# Patient Record
Sex: Male | Born: 1970
Health system: Southern US, Community
[De-identification: ages and names within clinical notes are randomized; demographics above are authoritative.]

## PROBLEM LIST (undated history)

## (undated) DIAGNOSIS — F1010 Alcohol abuse, uncomplicated: Secondary | ICD-10-CM

## (undated) DIAGNOSIS — I639 Cerebral infarction, unspecified: Secondary | ICD-10-CM

## (undated) DIAGNOSIS — Z8619 Personal history of other infectious and parasitic diseases: Secondary | ICD-10-CM

## (undated) DIAGNOSIS — E785 Hyperlipidemia, unspecified: Secondary | ICD-10-CM

## (undated) HISTORY — DX: Hyperlipidemia, unspecified: E78.5

## (undated) HISTORY — DX: Alcohol abuse, uncomplicated: F10.10

## (undated) HISTORY — DX: Personal history of other infectious and parasitic diseases: Z86.19

## (undated) HISTORY — DX: Cerebral infarction, unspecified: I63.9

---

## 1997-10-21 ENCOUNTER — Emergency Department (HOSPITAL_COMMUNITY): Admission: EM | Admit: 1997-10-21 | Discharge: 1997-10-21 | Payer: Self-pay | Admitting: Emergency Medicine

## 1998-09-20 ENCOUNTER — Emergency Department (HOSPITAL_COMMUNITY): Admission: EM | Admit: 1998-09-20 | Discharge: 1998-09-20 | Payer: Self-pay | Admitting: Emergency Medicine

## 2002-09-23 ENCOUNTER — Emergency Department (HOSPITAL_COMMUNITY): Admission: EM | Admit: 2002-09-23 | Discharge: 2002-09-23 | Payer: Self-pay | Admitting: Emergency Medicine

## 2011-05-23 DIAGNOSIS — I639 Cerebral infarction, unspecified: Secondary | ICD-10-CM

## 2011-05-23 HISTORY — DX: Cerebral infarction, unspecified: I63.9

## 2012-04-01 ENCOUNTER — Encounter (HOSPITAL_COMMUNITY): Payer: Self-pay | Admitting: Emergency Medicine

## 2012-04-01 ENCOUNTER — Emergency Department (HOSPITAL_COMMUNITY): Payer: Self-pay

## 2012-04-01 ENCOUNTER — Inpatient Hospital Stay (HOSPITAL_COMMUNITY)
Admission: EM | Admit: 2012-04-01 | Discharge: 2012-04-02 | DRG: 066 | Disposition: A | Discharge status: Home / Self Care | Payer: MEDICAID | Attending: Internal Medicine | Admitting: Internal Medicine

## 2012-04-01 ENCOUNTER — Other Ambulatory Visit: Payer: Self-pay

## 2012-04-01 DIAGNOSIS — F1010 Alcohol abuse, uncomplicated: Secondary | ICD-10-CM

## 2012-04-01 DIAGNOSIS — I6350 Cerebral infarction due to unspecified occlusion or stenosis of unspecified cerebral artery: Principal | ICD-10-CM | POA: Diagnosis present

## 2012-04-01 DIAGNOSIS — Z8673 Personal history of transient ischemic attack (TIA), and cerebral infarction without residual deficits: Secondary | ICD-10-CM

## 2012-04-01 DIAGNOSIS — F17200 Nicotine dependence, unspecified, uncomplicated: Secondary | ICD-10-CM | POA: Diagnosis present

## 2012-04-01 DIAGNOSIS — Z823 Family history of stroke: Secondary | ICD-10-CM

## 2012-04-01 DIAGNOSIS — I639 Cerebral infarction, unspecified: Secondary | ICD-10-CM

## 2012-04-01 DIAGNOSIS — E785 Hyperlipidemia, unspecified: Secondary | ICD-10-CM | POA: Diagnosis present

## 2012-04-01 LAB — CBC
HCT: 46.2 % (ref 39.0–52.0)
Hemoglobin: 15.4 g/dL (ref 13.0–17.0)
MCH: 36.4 pg — ABNORMAL HIGH (ref 26.0–34.0)
MCHC: 33.3 g/dL (ref 30.0–36.0)
MCV: 109.2 fL — ABNORMAL HIGH (ref 78.0–100.0)
Platelets: 237 10*3/uL (ref 150–400)
RBC: 4.23 MIL/uL (ref 4.22–5.81)
RDW: 14.1 % (ref 11.5–15.5)
WBC: 8.7 10*3/uL (ref 4.0–10.5)

## 2012-04-01 LAB — HEMOGLOBIN A1C: Mean Plasma Glucose: 103 mg/dL (ref <117)

## 2012-04-01 LAB — BASIC METABOLIC PANEL
Calcium: 9.3 mg/dL (ref 8.4–10.5)
GFR calc Af Amer: >90 mL/min (ref >90)
GFR calc non Af Amer: 85 mL/min — ABNORMAL LOW (ref >90)
Glucose, Bld: 94 mg/dL (ref 70–99)
Potassium: 4.2 mEq/L (ref 3.5–5.1)
Sodium: 145 mEq/L (ref 135–145)

## 2012-04-01 LAB — PROTIME-INR
INR: 0.87 (ref 0.00–1.49)
Prothrombin Time: 11.8 seconds (ref 11.6–15.2)

## 2012-04-01 LAB — POCT I-STAT, CHEM 8
Calcium, Ion: 1.17 mmol/L (ref 1.12–1.23)
Creatinine, Ser: 1.2 mg/dL (ref 0.50–1.35)
Glucose, Bld: 94 mg/dL (ref 70–99)
HCT: 49 % (ref 39.0–52.0)
Hemoglobin: 16.7 g/dL (ref 13.0–17.0)
TCO2: 26 mmol/L (ref 0–100)

## 2012-04-01 LAB — ETHANOL: Alcohol, Ethyl (B): <11 mg/dL (ref 0–11)

## 2012-04-01 LAB — APTT: aPTT: 26 seconds (ref 24–37)

## 2012-04-01 LAB — GLUCOSE, CAPILLARY: Glucose-Capillary: 107 mg/dL — ABNORMAL HIGH (ref 70–99)

## 2012-04-01 MED ORDER — LORAZEPAM 2 MG/ML IJ SOLN: 0.0000 mg | Freq: Four times a day (QID) | INTRAMUSCULAR | Status: DC

## 2012-04-01 MED ORDER — ASPIRIN EC 325 MG PO TBEC: 325.0000 mg | DELAYED_RELEASE_TABLET | Freq: Every day | ORAL | Status: DC

## 2012-04-01 MED ORDER — SODIUM CHLORIDE 0.9 % IJ SOLN: 3.0000 mL | Freq: Two times a day (BID) | INTRAMUSCULAR | Status: DC

## 2012-04-01 MED ORDER — LORAZEPAM 2 MG/ML IJ SOLN: 1.0000 mg | Freq: Four times a day (QID) | INTRAMUSCULAR | Status: DC | PRN

## 2012-04-01 MED ORDER — SODIUM CHLORIDE 0.9 % IJ SOLN: 3.0000 mL | INTRAMUSCULAR | Status: DC | PRN

## 2012-04-01 MED ORDER — ASPIRIN 81 MG PO CHEW
CHEWABLE_TABLET | ORAL | Status: AC
  Administered 2012-04-01: 81 mg
  Filled 2012-04-01: qty 1

## 2012-04-01 MED ORDER — VITAMIN B-1 100 MG PO TABS
100.0000 mg | ORAL_TABLET | Freq: Every day | ORAL | Status: DC
  Administered 2012-04-01 – 2012-04-02 (×2): 100 mg via ORAL
  Filled 2012-04-01 (×2): qty 1

## 2012-04-01 MED ORDER — ACETAMINOPHEN 650 MG RE SUPP: 650.0000 mg | Freq: Four times a day (QID) | RECTAL | Status: DC | PRN

## 2012-04-01 MED ORDER — LORAZEPAM 1 MG PO TABS: 1.0000 mg | ORAL_TABLET | Freq: Four times a day (QID) | ORAL | Status: DC | PRN

## 2012-04-01 MED ORDER — SODIUM CHLORIDE 0.9 % IV SOLN: 250.0000 mL | INTRAVENOUS | Status: DC | PRN

## 2012-04-01 MED ORDER — FOLIC ACID 1 MG PO TABS
1.0000 mg | ORAL_TABLET | Freq: Every day | ORAL | Status: DC
  Administered 2012-04-01 – 2012-04-02 (×2): 1 mg via ORAL
  Filled 2012-04-01 (×2): qty 1

## 2012-04-01 MED ORDER — ACETAMINOPHEN 325 MG PO TABS: 650.0000 mg | ORAL_TABLET | Freq: Four times a day (QID) | ORAL | Status: DC | PRN

## 2012-04-01 MED ORDER — ATORVASTATIN CALCIUM 80 MG PO TABS
80.0000 mg | ORAL_TABLET | Freq: Every day | ORAL | Status: DC
  Administered 2012-04-02: 80 mg via ORAL
  Filled 2012-04-01 (×2): qty 1

## 2012-04-01 MED ORDER — ENOXAPARIN SODIUM 40 MG/0.4ML ~~LOC~~ SOLN
40.0000 mg | SUBCUTANEOUS | Status: DC
  Filled 2012-04-01 (×2): qty 0.4

## 2012-04-01 MED ORDER — SODIUM CHLORIDE 0.9 % IV SOLN
INTRAVENOUS | Status: DC
  Administered 2012-04-01: 13:00:00 via INTRAVENOUS
  Administered 2012-04-01: 1000 mL via INTRAVENOUS
  Administered 2012-04-02: 14:00:00 via INTRAVENOUS
  Administered 2012-04-02: 1000 mL via INTRAVENOUS

## 2012-04-01 MED ORDER — STUDY - INVESTIGATIONAL DRUG SIMPLE RECORD
75.0000 mg | Freq: Every day | Status: DC
  Administered 2012-04-02: 75 mg via ORAL
  Filled 2012-04-01 (×2): qty 75

## 2012-04-01 MED ORDER — LORAZEPAM 2 MG/ML IJ SOLN: 0.0000 mg | Freq: Two times a day (BID) | INTRAMUSCULAR | Status: DC

## 2012-04-01 MED ORDER — STUDY - INVESTIGATIONAL DRUG SIMPLE RECORD
600.0000 mg | Status: AC
  Administered 2012-04-01: 600 mg via ORAL
  Filled 2012-04-01: qty 600

## 2012-04-01 MED ORDER — ASPIRIN EC 81 MG PO TBEC
81.0000 mg | DELAYED_RELEASE_TABLET | Freq: Every day | ORAL | Status: DC
  Administered 2012-04-02: 81 mg via ORAL
  Filled 2012-04-01: qty 1

## 2012-04-01 MED ORDER — ADULT MULTIVITAMIN W/MINERALS CH
1.0000 | ORAL_TABLET | Freq: Every day | ORAL | Status: DC
  Administered 2012-04-01 – 2012-04-02 (×2): 1 via ORAL
  Filled 2012-04-01 (×2): qty 1

## 2012-04-01 MED ORDER — THIAMINE HCL 100 MG/ML IJ SOLN
100.0000 mg | Freq: Every day | INTRAMUSCULAR | Status: DC
  Filled 2012-04-01 (×2): qty 1

## 2012-04-01 NOTE — H&P (Signed)
Hospital Admission Note Date: 04/01/2012  Patient name: Ernest Thomas. Medical record number: [id number] Date of birth: 03-22-1971 Age: 41 y.o. Gender: male PCP: DEFAULT,PROVIDER, MD  Internal Medicine Teaching Service  Attending physician:  Dr. Criselda Peaches     Internal Medicine Teaching Service Contact Information  1st Contact: Denton Ar, MD  [phone] 2nd Contact:  Lorretta Harp, MD   [phone]  After 5 pm or weekends: 1st Contact: Pager: [phone] 2nd Contact: Pager: [phone]   Chief Complaint: R sided numbness  History of Present Illness:  Ernest Thomas is a 41 yo gentleman with a history of alcohol abuse, long time absence from medical care, who presents with acute onset R sided numbness. He states that this began his symptoms began this morning around 9:30 am while watching TV. He noticed his R arm, leg, R side of face and R neck was numb and "felt funny." He says he may have had some weakness as well but he is not sure. These symptoms occurred on and off, fully resolving in between. He did not notice any alleviating or exacerbating symptoms, did not seem positional or activity related. His symptoms have now resolved.  He did have an episode with similar symptoms several months ago which resolved within 10 min. He did not seek medical attention at that time.  He denies having a PMHx besides coming to the hospital as a child for "fever." He has not seen a physician since he was a teenager.  No family history of stroke or MI that he knows of. His mother died of cancer and she also had blood clots. His father has a "hernia in his heart" but he doesn't know of any other conditions.  He smokes 1/3 ppd, drinks 3-6 beers per day. Last alcohol intake was yesterday (4 beers). Denies use of marijuana, cocaine, illegal drugs. He lives with his father and uncle, is out of work currently, used to work Holiday representative.  ROS negative for chest pain, SOB, headache, cough, fever, chills,  N/V, changes in vision. No recent stressors at home, no changes in his mood.   ROS positive for toothache.   Meds:   Medication List     As of 04/01/2012  4:09 PM    ASK your doctor about these medications         ibuprofen 200 MG tablet   Commonly known as: ADVIL,MOTRIN   Take 800 mg by mouth every 6 (six) hours as needed. for pain         Allergies: Allergies as of 04/01/2012  . (No Known Allergies)   History reviewed. No pertinent past medical history. History reviewed. No pertinent past surgical history. History reviewed. No pertinent family history. History   Social History  . Marital Status: Married    Spouse Name: N/A    Number of Children: N/A  . Years of Education: N/A   Occupational History  . Not on file.   Social History Main Topics  . Smoking status: Current Every Day Smoker  . Smokeless tobacco: Not on file  . Alcohol Use: Yes  . Drug Use: No  . Sexually Active:    Other Topics Concern  . Not on file   Social History Narrative  . No narrative on file    Review of Systems: Pertinent items noted in HPI   Physical Exam Blood pressure 128/78, pulse 69, temperature 97.6 F (36.4 C), temperature source Oral, resp. rate 18, SpO2 99.00%. General:  No acute distress, alert and oriented x  3, appears older than stated age HEENT:  PERRL, EOMI, moist mucous membranes, poor dentition, missing several teeth Cardiovascular:  Regular rate and rhythm, no murmurs, rubs or gallops Respiratory:  Clear to auscultation bilaterally, no wheezes, rales, or rhonchi Abdomen:  Soft, nondistended, nontender, +bowel sounds Extremities:  Warm and well-perfused, no clubbing, cyanosis, or edema. Pulses intact Skin: Warm, dry   Mental Status: Alert, oriented, no evidence of aphasia, some dysarthria due to poor dentition. Able to follow 3 step commands without difficulty.  Cranial Nerves:  II: Visual fields grossly normal, pupils equal, round, reactive to light and  accommodation  III,IV, VI: ptosis not present, extra-ocular motions intact bilaterally  V,VII: smile symmetric, facial sensation intact to light touch bilaterally  VIII: hearing intact to voice  IX,X: Uvula rises symmetrically  XI: bilateral shoulder shrug  XII: tongue midline Motor:  UE: 5/5 b/l, symmetric  LE: 5/5 symmetric Tone and bulk:normal tone throughout  Sensory: light touch intact throughout Deep Tendon Reflexes: Depressed, symmetric throughout  Plantars: Right: downgoing Left: downgoing  Cerebellar: finger-to-nose slightly delayed but no dysmetria, normal heel-to-shin test    Lab results: Basic Metabolic Panel:  Basename 04/01/12 1108 04/01/12 1105  NA 144 145  K 3.8 4.2  CL 107 104  CO2 -- 27  GLUCOSE 94 94  BUN 7 8  CREATININE 1.20 1.07  CALCIUM -- 9.3  MG -- --  PHOS -- --   CBC:  Basename 04/01/12 1108 04/01/12 1105  WBC -- 8.7  NEUTROABS -- --  HGB 16.7 15.4  HCT 49.0 46.2  MCV -- 109.2*  PLT -- 237   Cardiac Enzymes:  Basename 04/01/12 1105  CKTOTAL --  CKMB --  CKMBINDEX --  TROPONINI <0.30  CBG:  Basename 04/01/12 1100  GLUCAP 107*    Coagulation:  Basename 04/01/12 1105  LABPROT 11.8  INR 0.87   Urine Drug Screen: Drugs of Abuse  No results found for this basename: labopia,  cocainscrnur,  labbenz,  amphetmu,  thcu,  labbarb    Alcohol Level:  Basename 04/01/12 1833  ETH <11     Imaging results:  Ct Head Wo Contrast  04/01/2012  *RADIOLOGY REPORT*  Clinical Data: Right-sided weakness  CT HEAD WITHOUT CONTRAST  Technique:  Contiguous axial images were obtained from the base of the skull through the vertex without contrast.  Comparison: None.  Findings: The bony calvarium is intact.  No gross soft tissue abnormality is noted.  The ventricles are normal size and configuration.  No acute hemorrhage or space-occupying mass lesion is noted.  There is a vague area of decreased attenuation identified in the right parietal lobe  which measures 2.2 x 0.9 cm best seen on image number 19 of series 2.  An early area of ischemia would be difficult to exclude based on this exam.  IMPRESSION: A vague area of decreased attenuation in the right parietal lobe may represent acute ischemia.  The need for further imaging by means of MRI can be determined on a clinical basis.  Critical Value/emergent results were called by telephone at the time of interpretation on 04/01/2012 at 1118 hours to Dr. Thad Ranger, who verbally acknowledged these results.   Original Report Authenticated By: Alcide Clever, M.D.    Mr [facility] Wo Contrast  04/01/2012  *RADIOLOGY REPORT*  Clinical Data:  Right arm and leg sensory symptoms.  Code stroke. Symptoms have now fully resolved.  MRI HEAD WITHOUT CONTRAST MRA HEAD WITHOUT CONTRAST  Technique:  Multiplanar, multiecho pulse sequences  of the brain and surrounding structures were obtained without intravenous contrast. Angiographic images of the head were obtained using MRA technique without contrast.  Comparison:  CT head 04/01/2012  MRI HEAD  Findings:  There is a subcentimeter area of restricted diffusion in the dorsolateral thalamus on the left (image 19 series 3, arrow) consistent with acute infarction.  No associated hemorrhage or mass effect.  There is slight premature atrophy. No significant white matter disease.  Remote lacunar infarct versus perivascular space left posterior frontal subcortical white matter.  The area of concern in the right subcortical white matter on prior CT appears unremarkable on MR.  No areas of acute or chronic hemorrhage.  No worrisome osseous lesions.  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  Microadenoma less likely.  Doubt craniopharyngioma.  Normal cerebellar tonsils and upper cervical region.  No acute sinus or mastoid disease.  Negative orbits.  IMPRESSION: Subcentimeter  area of restricted diffusion in the left dorsolateral thalamus consistent with acute infarction.  No associated hemorrhage or mass effect.  No right parietal abnormality is seen.  Slight premature atrophy.  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  MRA HEAD  Findings: The internal carotid arteries are widely patent.  The basilar artery is widely patent with the vertebrals codominant. There is no proximal stenosis of the anterior, middle, or posterior cerebral arteries.  There is no intracranial aneurysm observed.  IMPRESSION: Negative MRA intracranial circulation.   Original Report Authenticated By: Davonna Belling, M.D.    Mr Brain Wo Contrast  04/01/2012  *RADIOLOGY REPORT*  Clinical Data:  Right arm and leg sensory symptoms.  Code stroke. Symptoms have now fully resolved.  MRI HEAD WITHOUT CONTRAST MRA HEAD WITHOUT CONTRAST  Technique:  Multiplanar, multiecho pulse sequences of the brain and surrounding structures were obtained without intravenous contrast. Angiographic images of the head were obtained using MRA technique without contrast.  Comparison:  CT head 04/01/2012  MRI HEAD  Findings:  There is a subcentimeter area of restricted diffusion in the dorsolateral thalamus on the left (image 19 series 3, arrow) consistent with acute infarction.  No associated hemorrhage or mass effect.  There is slight premature atrophy. No significant white matter disease.  Remote lacunar infarct versus perivascular space left posterior frontal subcortical white matter.  The area of concern in the right subcortical white matter on prior CT appears unremarkable on MR.  No areas of acute or chronic hemorrhage.  No worrisome osseous lesions.  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  Microadenoma less likely.  Doubt craniopharyngioma.  Normal cerebellar tonsils and upper cervical region.   No acute sinus or mastoid disease.  Negative orbits.  IMPRESSION: Subcentimeter area of restricted diffusion in the left dorsolateral thalamus consistent with acute infarction.  No associated hemorrhage or mass effect.  No right parietal abnormality is seen.  Slight premature atrophy.  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  MRA HEAD  Findings: The internal carotid arteries are widely patent.  The basilar artery is widely patent with the vertebrals codominant. There is no proximal stenosis of the anterior, middle, or posterior cerebral arteries.  There is no intracranial aneurysm observed.  IMPRESSION: Negative MRA intracranial circulation.  Original Report Authenticated By: Davonna Belling, M.D.      Assessment & Plan by Problem:  Mr. Schwertner is a 41 year old gentleman without significant past medical history except for alcohol abuse, who presents with acute right-sided numbness. MRI confirmed ischemic stroke.  #: Thalamic infarct: Patient's clinical presentation and MRI are consistent with ischemic stroke/TIA. Patient has not visited a hospital or seen a physician for a check up in many years (>20), so it is not clear whether patient has risk factors at this time. Currently, patient's mental status is normal. He does not have dysphagia or chest pain. Neurology was consulted in ED, patient was started with aspirin and Plavix. EKG no ischemic change and troponin negative x1. Symptoms of numbness 1 month prior likely a TIA.  - Will admit to telemetry -  Appreciate neurology's consultation in management of our patient. - Started Plavix, high dose lipitor, and continue ASA per Neurology. - Check carotid dopplers to assess for stenosis given established vascular disease (CAD). - Risk factor stratification: HbA1,  FLP, TSH, UDS -  If all tests negative, will consider doing hypercoagulability panel given his young age. - Cycle cardiac enzymes and repeat 12 lead EKG in the  morning - Will get 2D transthoracic echocardiography. - PT/OT consult -permissive hypertension -NPO before swallow study, then regular diet  # Alcohol abuse: Patient is a everyday drinker, 3-6 beers per day. Last reported intake was 11/10.  -Check alcohol level -initiate CIWA protocol -Consult to social work  # DVT PPx: Lovenox   Signed: Denton Ar 04/01/2012, 4:09 PM

## 2012-04-01 NOTE — ED Notes (Signed)
Pt here with right sided numbness and weakness that is intermittent but last for last hour has been constant; pt with no obvious neuro deficits at present; EDP DY to see pt and call code stroke; pt denies problem with speech

## 2012-04-01 NOTE — Progress Notes (Signed)
  Echocardiogram 2D Echocardiogram has been performed.  Cathie Beams 04/01/2012, 4:05 PM

## 2012-04-01 NOTE — ED Notes (Signed)
Chem 8 results as follows: Na  144  mmo K   3.8 Cl   107 iCa  1.17 TC02   26 Glu  94 mg/dL BUN  7 Crea  1.2 Hct  16% Hb  16.7 AnGap  16 mmo Double checked before charting

## 2012-04-01 NOTE — Research (Signed)
Patient was admitted to the hospital for possible stroke. Patient was thought to be a potential candidate for the POINT research trial. Patient was given the POINT informed consent to read. Patient requested that we call his father, Ernest Thomas, to inform him of the trial. I spoke with patient's father via telephone about the POINT trial and he encouraged participation. Patient read the informed consent form with assistance from the unit nurse and agreed to participate. Patient was given time to read the informed consent and ask questions. Patient signed the informed consent and a copy was given to patient for personal record. Patient met the inclusion/exclusion criteria and was randomized to kit# [id number]. Patient was given 8 tabs of study drug at 17:57.

## 2012-04-01 NOTE — ED Notes (Signed)
Pt reported at 1107 that all s/s had resolved. Also, reports he has had these episodes of numbness/tingling in the past but unable to report when. Pt poor historian. Phlebotomy arrival 1055 To CT 1103 with radiology staff at bedside Stroke team and neurologist at bedside at this time

## 2012-04-01 NOTE — ED Notes (Signed)
D/C code stroke per neuro at 11:15

## 2012-04-01 NOTE — ED Notes (Addendum)
Pt reporting to stroke team and Katrinka Blazing, PA-C that numbness to right arm is returning. Pt speech is rapid and slurred.

## 2012-04-01 NOTE — ED Provider Notes (Signed)
History     CSN: [id number]  Arrival date & time 04/01/12  1045   First MD Initiated Contact with Patient 04/01/12 1102      Chief Complaint  Patient presents with  . Numbness    (Consider location/radiation/quality/duration/timing/severity/associated sxs/prior treatment) The history is provided by the patient.  41 year old male, no PCM, screened by triage physician and Code Stroke called. Onset of right sided weakness and numbness at 0930. Patient feels that the weakness resolved 1 and 1/2 hours ago. No headache, speech problems, or hx of similar symptoms. Head CT completed, results pending, and patient seen by stroke team neurologist. Denies pain.   History reviewed. No pertinent past medical history.  History reviewed. No pertinent past surgical history.  History reviewed. No pertinent family history.  History  Substance Use Topics  . Smoking status: Current Every Day Smoker  . Smokeless tobacco: Not on file  . Alcohol Use: Yes      Review of Systems  Constitutional: Negative for fever.  HENT: Negative for hearing loss, drooling, trouble swallowing and neck pain.   Eyes: Negative for visual disturbance.  Respiratory: Negative for shortness of breath.   Cardiovascular: Negative for chest pain.  Gastrointestinal: Negative for nausea, vomiting and abdominal pain.  Genitourinary: Negative for dysuria.  Musculoskeletal: Negative for back pain.  Skin: Negative for rash.  Neurological: Positive for weakness and numbness. Negative for dizziness, facial asymmetry, speech difficulty and headaches.  Hematological: Does not bruise/bleed easily.  Psychiatric/Behavioral: Negative for confusion.    Allergies  Review of patient's allergies indicates no known allergies.  Home Medications   Current Outpatient Rx  Name  Route  Sig  Dispense  Refill  . IBUPROFEN 200 MG PO TABS   Oral   Take 800 mg by mouth every 6 (six) hours as needed. for pain           BP 131/93   Pulse 77  Temp 97.6 F (36.4 C) (Oral)  Resp 17  SpO2 99%  Physical Exam  Nursing note and vitals reviewed. Constitutional: He is oriented to person, place, and time. He appears well-developed and well-nourished. No distress.  HENT:  Head: Normocephalic and atraumatic.  Mouth/Throat: Oropharynx is clear and moist.  Eyes: Conjunctivae normal and EOM are normal. Pupils are equal, round, and reactive to light.  Neck: Normal range of motion. Neck supple.  Cardiovascular: Normal rate, regular rhythm, normal heart sounds and intact distal pulses.   No murmur heard. Pulmonary/Chest: Effort normal and breath sounds normal. No respiratory distress.  Abdominal: Soft. Bowel sounds are normal. There is no tenderness.  Musculoskeletal: Normal range of motion. He exhibits no edema and no tenderness.  Neurological: He is alert and oriented to person, place, and time. No cranial nerve deficit. He exhibits normal muscle tone. Coordination normal.       But subtle right sided weakness RUE and RLE on my exam.   Skin: Skin is warm. No erythema.    ED Course  Procedures (including critical care time)  Labs Reviewed  GLUCOSE, CAPILLARY - Abnormal; Notable for the following:    Glucose-Capillary 107 (*)     All other components within normal limits  CBC - Abnormal; Notable for the following:    MCV 109.2 (*)     MCH 36.4 (*)     All other components within normal limits  BASIC METABOLIC PANEL - Abnormal; Notable for the following:    GFR calc non Af Amer 85 (*)  All other components within normal limits  PROTIME-INR  APTT  TROPONIN I  POCT I-STAT, CHEM 8  HEMOGLOBIN A1C  LIPID PANEL   Ct Head Wo Contrast  04/01/2012  *RADIOLOGY REPORT*  Clinical Data: Right-sided weakness  CT HEAD WITHOUT CONTRAST  Technique:  Contiguous axial images were obtained from the base of the skull through the vertex without contrast.  Comparison: None.  Findings: The bony calvarium is intact.  No gross soft  tissue abnormality is noted.  The ventricles are normal size and configuration.  No acute hemorrhage or space-occupying mass lesion is noted.  There is a vague area of decreased attenuation identified in the right parietal lobe which measures 2.2 x 0.9 cm best seen on image number 19 of series 2.  An early area of ischemia would be difficult to exclude based on this exam.  IMPRESSION: A vague area of decreased attenuation in the right parietal lobe may represent acute ischemia.  The need for further imaging by means of MRI can be determined on a clinical basis.  Critical Value/emergent results were called by telephone at the time of interpretation on 04/01/2012 at 1118 hours to Dr. Thad Ranger, who verbally acknowledged these results.   Original Report Authenticated By: Alcide Clever, M.D.    Results for orders placed during the hospital encounter of 04/01/12  GLUCOSE, CAPILLARY      Component Value Range   Glucose-Capillary 107 (*) 70 - 99 mg/dL  CBC      Component Value Range   WBC 8.7  4.0 - 10.5 K/uL   RBC 4.23  4.22 - 5.81 MIL/uL   Hemoglobin 15.4  13.0 - 17.0 g/dL   HCT 21.3  08.6 - 57.8 %   MCV 109.2 (*) 78.0 - 100.0 fL   MCH 36.4 (*) 26.0 - 34.0 pg   MCHC 33.3  30.0 - 36.0 g/dL   RDW 46.9  62.9 - 52.8 %   Platelets 237  150 - 400 K/uL  BASIC METABOLIC PANEL      Component Value Range   Sodium 145  135 - 145 mEq/L   Potassium 4.2  3.5 - 5.1 mEq/L   Chloride 104  96 - 112 mEq/L   CO2 27  19 - 32 mEq/L   Glucose, Bld 94  70 - 99 mg/dL   BUN 8  6 - 23 mg/dL   Creatinine, Ser 4.13  0.50 - 1.35 mg/dL   Calcium 9.3  8.4 - 24.4 mg/dL   GFR calc non Af Amer 85 (*) >90 mL/min   GFR calc Af Amer >90  >90 mL/min  PROTIME-INR      Component Value Range   Prothrombin Time 11.8  11.6 - 15.2 seconds   INR 0.87  0.00 - 1.49  APTT      Component Value Range   aPTT 26  24 - 37 seconds  TROPONIN I      Component Value Range   Troponin I <0.30  <0.30 ng/mL  POCT I-STAT, CHEM 8      Component  Value Range   Sodium 144  135 - 145 mEq/L   Potassium 3.8  3.5 - 5.1 mEq/L   Chloride 107  96 - 112 mEq/L   BUN 7  6 - 23 mg/dL   Creatinine, Ser 0.10  0.50 - 1.35 mg/dL   Glucose, Bld 94  70 - 99 mg/dL   Calcium, Ion 2.72  5.36 - 1.23 mmol/L   TCO2 26  0 - 100  mmol/L   Hemoglobin 16.7  13.0 - 17.0 g/dL   HCT 16.1  09.6 - 04.5 %    Date: 04/01/2012  Rate: 70  Rhythm: normal sinus rhythm  QRS Axis: normal  Intervals: normal  ST/T Wave abnormalities: nonspecific ST/T changes  Conduction Disutrbances:none  Narrative Interpretation:   Old EKG Reviewed: none available    1. CVA (cerebral infarction)    CRITICAL CARE Performed by: Shelda Jakes.   Total critical care time: 30  Critical care time was exclusive of separately billable procedures and treating other patients.  Critical care was necessary to treat or prevent imminent or life-threatening deterioration.  Critical care was time spent personally by me on the following activities: development of treatment plan with patient and/or surrogate as well as nursing, discussions with consultants, evaluation of patient's response to treatment, examination of patient, obtaining history from patient or surrogate, ordering and performing treatments and interventions, ordering and review of laboratory studies, ordering and review of radiographic studies, pulse oximetry and re-evaluation of patient's condition.    MDM  Patient initially made a code stroke head CT shows right parietal concern however patient's symptoms are on the right side so does not marry up but was seen today. Patient still has some subtle weakness to the right side not severe. Also subjective numbness still present. The significant weakness improved at around 11:30. Onset of symptoms were at 9:30. Dr. Thad Ranger from neurology service states patient does not need an urgent MRI today however does require admission. Is not a candidate for the TIA protocol. Will be  admitted by teaching internal medicine service. No history of similar symptoms. Past medical history is negative        Shelda Jakes, MD 04/03/12 [phone]

## 2012-04-01 NOTE — Progress Notes (Signed)
*  PRELIMINARY RESULTS* Vascular Ultrasound Carotid Duplex (Doppler) has been completed. There is no obvious evidence of hemodynamically significant internal carotid artery stenosis. Vertebral arteries are patent with antegrade flow.  04/01/2012 4:24 PM Gertie Fey, RDMS, RDCS

## 2012-04-01 NOTE — ED Notes (Signed)
Pt reports symptoms resolved

## 2012-04-01 NOTE — Consult Note (Signed)
Referring physician: Deretha Emory    Chief Complaint: Right sided numbness/Code stroke  HPI:                                                                                                                                         Ernest Thomas is an 41 y.o. male who had awoken at 6 am this morning. Initially he was at his baseline. He was watching T.V. When at 9:30 am he noted his right arm and leg was tingling.  He tried to get up and found walking was difficult due to decreased sensation--not strength. Patient called EMS and was brought to [facility] as a code stroke. By the time he arrived patients symptoms had fully resolved. CT head was negative for acute mass, bleed or infarct with some question of decreased attenuation in the right parietal lobe--which would not corrilate with right sided symptoms. At present time patient is back to his baseline with no focal localizing or lateralizing neurological findings. NIHSS 0.   Of note: Patient had similar symptoms one month ago which lasted for 10 minutes and resolved. He did not seek attention at that time.   LSN: [id number] tPA Given: No: resolution of symptoms  No pertinent past medical history.  No pertinent past surgical history.  Family history Father -None Mother-CVA, "blood clots"   Social History: Lives with father.  Smokes 1/3 pack a day, Drinks 3-4 beers daily, no Hard liqueur. No illicit drugs   Allergies: No Known Allergies  Medications:                                                                                                                           Current Facility-Administered Medications  Medication Dose Route Frequency Provider Last Rate Last Dose  . 0.9 %  sodium chloride infusion   Intravenous Continuous Hurman Horn, MD      . 0.9 %  sodium chloride infusion  250 mL Intravenous PRN Hurman Horn, MD      . sodium chloride 0.9 % injection 3 mL  3 mL Intravenous Q12H Hurman Horn, MD      . sodium chloride 0.9  % injection 3 mL  3 mL Intravenous PRN Hurman Horn, MD       Current Outpatient Prescriptions  Medication Sig Dispense Refill  . ibuprofen (ADVIL,MOTRIN) 200 MG tablet  Take 800 mg by mouth every 6 (six) hours as needed. for pain        ROS:                                                                                                                                       History obtained from the patient  General ROS: negative for - chills, fatigue, fever, night sweats, weight gain or weight loss Psychological ROS: negative for - behavioral disorder, hallucinations, memory difficulties, mood swings or suicidal ideation Ophthalmic ROS: negative for - blurry vision, double vision, eye pain or loss of vision ENT ROS: Positive for - Toothache Allergy and Immunology ROS: negative for - hives or itchy/watery eyes Hematological and Lymphatic ROS: negative for - bleeding problems, bruising or swollen lymph nodes Endocrine ROS: negative for - galactorrhea, hair pattern changes, polydipsia/polyuria or temperature intolerance Respiratory ROS: negative for - cough, hemoptysis, shortness of breath or wheezing Cardiovascular ROS: negative for - chest pain, dyspnea on exertion, edema or irregular heartbeat Gastrointestinal ROS: negative for - abdominal pain, diarrhea, hematemesis, nausea/vomiting or stool incontinence Genito-Urinary ROS: negative for - dysuria, hematuria, incontinence or urinary frequency/urgency Musculoskeletal ROS: negative for - joint swelling or muscular weakness Neurological ROS: as noted in HPI Dermatological ROS: negative for rash and skin lesion changes  Neurologic Examination:                                                                                                      Blood pressure 154/112, pulse 82, temperature 98 F (36.7 C), temperature source Oral, resp. rate 18, SpO2 100.00%.   Mental Status: Alert, oriented, thought content appropriate.  Speech fluent  without evidence of aphasia, some dysarthria due to teeth loss.  Able to follow 3 step commands without difficulty. Cranial Nerves: II: Discs flat bilaterally; Visual fields grossly normal, pupils equal, round, reactive to light and accommodation III,IV, VI: ptosis not present, extra-ocular motions intact bilaterally V,VII: smile symmetric, facial light touch sensation normal bilaterally VIII: hearing normal bilaterally IX,X: Uvula rises symmetrically XI: bilateral shoulder shrug XII: midline tongue extension Motor: Right : Upper extremity   5/5    Left:     Upper extremity   5/5  Lower extremity   5/5     Lower extremity   5/5 Tone and bulk:normal tone throughout; no atrophy noted Sensory: Pinprick and light touch intact throughout, bilaterally Deep Tendon Reflexes: Depressed and symmetric throughout Plantars: Right: downgoing   Left: downgoing Cerebellar: normal finger-to-nose,  normal heel-to-shin test CV: pulses palpable throughout     Results for orders placed during the hospital encounter of 04/01/12 (from the past 48 hour(s))  GLUCOSE, CAPILLARY     Status: Abnormal   Collection Time   04/01/12 11:00 AM      Component Value Range Comment   Glucose-Capillary 107 (*) 70 - 99 mg/dL   CBC     Status: Abnormal   Collection Time   04/01/12 11:05 AM      Component Value Range Comment   WBC 8.7  4.0 - 10.5 K/uL    RBC 4.23  4.22 - 5.81 MIL/uL    Hemoglobin 15.4  13.0 - 17.0 g/dL    HCT 78.2  95.6 - 21.3 %    MCV 109.2 (*) 78.0 - 100.0 fL    MCH 36.4 (*) 26.0 - 34.0 pg    MCHC 33.3  30.0 - 36.0 g/dL    RDW 08.6  57.8 - 46.9 %    Platelets 237  150 - 400 K/uL   POCT I-STAT, CHEM 8     Status: Normal   Collection Time   04/01/12 11:08 AM      Component Value Range Comment   Sodium 144  135 - 145 mEq/L    Potassium 3.8  3.5 - 5.1 mEq/L    Chloride 107  96 - 112 mEq/L    BUN 7  6 - 23 mg/dL    Creatinine, Ser 6.29  0.50 - 1.35 mg/dL    Glucose, Bld 94  70 - 99 mg/dL     Calcium, Ion 5.28  1.12 - 1.23 mmol/L    TCO2 26  0 - 100 mmol/L    Hemoglobin 16.7  13.0 - 17.0 g/dL    HCT 41.3  24.4 - 01.0 %    Ct Head Wo Contrast  04/01/2012  *RADIOLOGY REPORT*  Clinical Data: Right-sided weakness  CT HEAD WITHOUT CONTRAST  Technique:  Contiguous axial images were obtained from the base of the skull through the vertex without contrast.  Comparison: None.  Findings: The bony calvarium is intact.  No gross soft tissue abnormality is noted.  The ventricles are normal size and configuration.  No acute hemorrhage or space-occupying mass lesion is noted.  There is a vague area of decreased attenuation identified in the right parietal lobe which measures 2.2 x 0.9 cm best seen on image number 19 of series 2.  An early area of ischemia would be difficult to exclude based on this exam.  IMPRESSION: A vague area of decreased attenuation in the right parietal lobe may represent acute ischemia.  The need for further imaging by means of MRI can be determined on a clinical basis.  Critical Value/emergent results were called by telephone at the time of interpretation on 04/01/2012 at 1118 hours to Dr. Thad Ranger, who verbally acknowledged these results.   Original Report Authenticated By: Alcide Clever, M.D.    Felicie Morn PA-C [facility] [phone]  04/01/2012, 11:35 AM   Patient seen and examined.  Clinical course and management discussed.  Necessary edits performed.  I agree with the above.  Assessment and plan of care developed and discussed below.    Assessment: 41 y.o. male with transient right arm and leg paresthesia which has fully resolved.  Has minimal risk factors.  Will rule out TIA.  Work up inidicated.  On no antiplatelet therapy.    Stroke Risk Factors - smoking  Plan: 1. HgbA1c, fasting lipid panel 2.  MRI, MRA  of the brain without contrast 3. PT consult, OT consult, Speech consult 4. Echocardiogram 5. Carotid dopplers 6. Prophylactic therapy-Antiplatelet  med: Aspirin - dose 81 mg daily 7. Risk factor modification 8. Telemetry monitoring 9. Frequent neuro checks  Thana Farr, MD [facility] [phone]  04/01/2012  2:11 PM

## 2012-04-02 DIAGNOSIS — E785 Hyperlipidemia, unspecified: Secondary | ICD-10-CM

## 2012-04-02 LAB — LIPID PANEL
HDL: 32 mg/dL — ABNORMAL LOW (ref >39)
LDL Cholesterol: 157 mg/dL — ABNORMAL HIGH (ref 0–99)
Total CHOL/HDL Ratio: 8.3 RATIO
VLDL: 76 mg/dL — ABNORMAL HIGH (ref 0–40)

## 2012-04-02 LAB — COMPREHENSIVE METABOLIC PANEL
ALT: 21 U/L (ref 0–53)
AST: 29 U/L (ref 0–37)
Albumin: 3 g/dL — ABNORMAL LOW (ref 3.5–5.2)
Alkaline Phosphatase: 72 U/L (ref 39–117)
CO2: 25 mEq/L (ref 19–32)
Chloride: 110 mEq/L (ref 96–112)
GFR calc non Af Amer: >90 mL/min (ref >90)
Potassium: 4.1 mEq/L (ref 3.5–5.1)
Total Bilirubin: 0.5 mg/dL (ref 0.3–1.2)

## 2012-04-02 LAB — RAPID URINE DRUG SCREEN, HOSP PERFORMED
Amphetamines: NOT DETECTED
Barbiturates: NOT DETECTED
Benzodiazepines: NOT DETECTED
Cocaine: NOT DETECTED

## 2012-04-02 LAB — TSH: TSH: 1.298 u[IU]/mL (ref 0.350–4.500)

## 2012-04-02 LAB — SEDIMENTATION RATE: Sed Rate: 2 mm/hr (ref 0–16)

## 2012-04-02 LAB — ANTITHROMBIN III: AntiThromb III Func: 110 % (ref 75–120)

## 2012-04-02 LAB — RPR: RPR Ser Ql: NONREACTIVE

## 2012-04-02 MED ORDER — STUDY - INVESTIGATIONAL DRUG SIMPLE RECORD: 75.0000 mg | Freq: Every day | Status: DC

## 2012-04-02 MED ORDER — PRAVASTATIN SODIUM 40 MG PO TABS: 40.0000 mg | ORAL_TABLET | Freq: Every day | ORAL | Status: DC

## 2012-04-02 MED ORDER — ADULT MULTIVITAMIN W/MINERALS CH: 1.0000 | ORAL_TABLET | Freq: Every day | ORAL | Status: DC

## 2012-04-02 MED ORDER — ASPIRIN EC 325 MG PO TBEC: 325.0000 mg | DELAYED_RELEASE_TABLET | Freq: Every day | ORAL | Status: DC

## 2012-04-02 NOTE — Progress Notes (Signed)
Subjective: Ernest Thomas is doing well this morning. His symptoms of R sided numbness have completely resolved. No trouble walking, talking, no weakness. He is asking whether he can go home  Objective: Vital signs in last 24 hours: Filed Vitals:   04/01/12 2251 04/02/12 0200 04/02/12 0600 04/02/12 1024  BP: 118/84 118/86 132/86 130/88  Pulse: 64 64 65 66  Temp: 97.6 F (36.4 C) 97.5 F (36.4 C) 97.7 F (36.5 C) 97.5 F (36.4 C)  TempSrc: Oral Oral Oral Oral  Resp: 20 20 20 18   Height: 5\' 9"  (1.753 m)     Weight: 152 lb 6 oz (69.117 kg)  151 lb 10.8 oz (68.8 kg)   SpO2: 98% 98% 98% 99%   Weight change:  No intake or output data in the 24 hours ending 04/02/12 1040  Physical Exam Blood pressure 130/88, pulse 66, temperature 97.5 F (36.4 C), temperature source Oral, resp. rate 18, height 5\' 9"  (1.753 m), weight 151 lb 10.8 oz (68.8 kg), SpO2 99.00%. General:  No acute distress, alert and oriented x 3, well-appearing  HEENT:  PERRL, EOMI, no lymphadenopathy, moist mucous membranes Cardiovascular:  Regular rate and rhythm, no murmurs, rubs or gallops Respiratory:  Clear to auscultation bilaterally, no wheezes, rales, or rhonchi Abdomen:  Soft, nondistended, nontender, normoactive bowel sounds Extremities:  Warm and well-perfused, no clubbing, cyanosis, or edema.  Skin: Warm, dry, no rashes Neuro: Cranial nerves intact, sensation and motor strength in tact throughout. Finger-nose-finger intact. Decreased reflexes throughout.   Lab Results: CBC    Component Value Date/Time   WBC 8.7 04/01/2012 1105   RBC 4.23 04/01/2012 1105   HGB 16.7 04/01/2012 1108   HCT 49.0 04/01/2012 1108   PLT 237 04/01/2012 1105   MCV 109.2* 04/01/2012 1105   MCH 36.4* 04/01/2012 1105   MCHC 33.3 04/01/2012 1105   RDW 14.1 04/01/2012 1105    BMET    Component Value Date/Time   NA 143 04/02/2012 0648   K 4.1 04/02/2012 0648   CL 110 04/02/2012 0648   CO2 25 04/02/2012 0648   GLUCOSE 97  04/02/2012 0648   BUN 7 04/02/2012 0648   CREATININE 0.95 04/02/2012 0648   CALCIUM 8.4 04/02/2012 0648   GFRNONAA >90 04/02/2012 0648   GFRAA >90 04/02/2012 1610     Micro Results: No results found for this or any previous visit (from the past 240 hour(s)).  Studies/Results: Ct Head Wo Contrast  04/01/2012  *RADIOLOGY REPORT*  Clinical Data: Right-sided weakness  CT HEAD WITHOUT CONTRAST  Technique:  Contiguous axial images were obtained from the base of the skull through the vertex without contrast.  Comparison: None.  Findings: The bony calvarium is intact.  No gross soft tissue abnormality is noted.  The ventricles are normal size and configuration.  No acute hemorrhage or space-occupying mass lesion is noted.  There is a vague area of decreased attenuation identified in the right parietal lobe which measures 2.2 x 0.9 cm best seen on image number 19 of series 2.  An early area of ischemia would be difficult to exclude based on this exam.  IMPRESSION: A vague area of decreased attenuation in the right parietal lobe may represent acute ischemia.  The need for further imaging by means of MRI can be determined on a clinical basis.  Critical Value/emergent results were called by telephone at the time of interpretation on 04/01/2012 at 1118 hours to Dr. Thad Ranger, who verbally acknowledged these results.   Original Report Authenticated By:  Alcide Clever, M.D.    Ernest Thomas Head Wo Contrast  04/01/2012  *RADIOLOGY REPORT*  Clinical Data:  Right arm and leg sensory symptoms.  Code stroke. Symptoms have now fully resolved.  MRI HEAD WITHOUT CONTRAST MRA HEAD WITHOUT CONTRAST  Technique:  Multiplanar, multiecho pulse sequences of the brain and surrounding structures were obtained without intravenous contrast. Angiographic images of the head were obtained using MRA technique without contrast.  Comparison:  CT head 04/01/2012  MRI HEAD  Findings:  There is a subcentimeter area of restricted diffusion in the  dorsolateral thalamus on the left (image 19 series 3, arrow) consistent with acute infarction.  No associated hemorrhage or mass effect.  There is slight premature atrophy. No significant white matter disease.  Remote lacunar infarct versus perivascular space left posterior frontal subcortical white matter.  The area of concern in the right subcortical white matter on prior CT appears unremarkable on Ernest.  No areas of acute or chronic hemorrhage.  No worrisome osseous lesions.  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  Microadenoma less likely.  Doubt craniopharyngioma.  Normal cerebellar tonsils and upper cervical region.  No acute sinus or mastoid disease.  Negative orbits.  IMPRESSION: Subcentimeter area of restricted diffusion in the left dorsolateral thalamus consistent with acute infarction.  No associated hemorrhage or mass effect.  No right parietal abnormality is seen.  Slight premature atrophy.  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  MRA HEAD  Findings: The internal carotid arteries are widely patent.  The basilar artery is widely patent with the vertebrals codominant. There is no proximal stenosis of the anterior, middle, or posterior cerebral arteries.  There is no intracranial aneurysm observed.  IMPRESSION: Negative MRA intracranial circulation.   Original Report Authenticated By: Davonna Belling, M.D.    Ernest Brain Wo Contrast  04/01/2012  *RADIOLOGY REPORT*  Clinical Data:  Right arm and leg sensory symptoms.  Code stroke. Symptoms have now fully resolved.  MRI HEAD WITHOUT CONTRAST MRA HEAD WITHOUT CONTRAST  Technique:  Multiplanar, multiecho pulse sequences of the brain and surrounding structures were obtained without intravenous contrast. Angiographic images of the head were obtained using MRA technique without contrast.  Comparison:  CT head 04/01/2012   MRI HEAD  Findings:  There is a subcentimeter area of restricted diffusion in the dorsolateral thalamus on the left (image 19 series 3, arrow) consistent with acute infarction.  No associated hemorrhage or mass effect.  There is slight premature atrophy. No significant white matter disease.  Remote lacunar infarct versus perivascular space left posterior frontal subcortical white matter.  The area of concern in the right subcortical white matter on prior CT appears unremarkable on Ernest.  No areas of acute or chronic hemorrhage.  No worrisome osseous lesions.  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  Microadenoma less likely.  Doubt craniopharyngioma.  Normal cerebellar tonsils and upper cervical region.  No acute sinus or mastoid disease.  Negative orbits.  IMPRESSION: Subcentimeter area of restricted diffusion in the left dorsolateral thalamus consistent with acute infarction.  No associated hemorrhage or mass effect.  No right parietal abnormality is seen.  Slight premature atrophy.  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  MRA HEAD  Findings: The internal carotid arteries are widely patent.  The basilar artery is widely patent with the vertebrals codominant. There is no proximal stenosis of the anterior, middle, or posterior cerebral arteries.  There is no intracranial aneurysm observed.  IMPRESSION: Negative MRA intracranial circulation.   Original Report Authenticated By: Davonna Belling, M.D.     Medications: medications reviewed Scheduled Meds:   . [COMPLETED] aspirin      . aspirin EC  81 mg Oral Daily  . atorvastatin  80 mg Oral q1800  . enoxaparin (LOVENOX) injection  40 mg Subcutaneous Q24H  . folic acid  1 mg Oral Daily  . multivitamin with minerals  1 tablet Oral Daily  . Point Trial - clopidogrel / placebo (daily dose)  (PI-Sethi)  75 mg Oral Q breakfast   . [COMPLETED] Point Trial - clopidogrel / placebo (now dose)  (PI-Sethi)  600 mg Oral NOW  . sodium chloride  3 mL Intravenous Q12H  . sodium chloride  3 mL Intravenous Q12H  . thiamine  100 mg Oral Daily   Or  . thiamine  100 mg Intravenous Daily  . [DISCONTINUED] aspirin EC  325 mg Oral Daily  . [DISCONTINUED] LORazepam  0-4 mg Intravenous Q6H  . [DISCONTINUED] LORazepam  0-4 mg Intravenous Q12H   Continuous Infusions:   . sodium chloride 1,000 mL (04/02/12 0446)   PRN Meds:.sodium chloride, acetaminophen, acetaminophen, LORazepam, LORazepam, sodium chloride  Assessment/Plan:   Ernest Thomas is a 41 year old gentleman without significant past medical history except for alcohol abuse, who presents with acute right-sided numbness. MRI confirmed ischemic stroke.   #: Thalamic infarct: Patient's clinical presentation and MRI are consistent with ischemic stroke/TIA. Patient has not visited a hospital or seen a physician for a check up in many years (>20), so it is not clear whether patient has risk factors at this time. Currently, patient's mental status is normal. He does not have dysphagia or chest pain. Neurology was consulted in ED, patient was started with aspirin and Plavix. EKG no ischemic change and troponin negative x1. Symptoms of numbness 1 month prior likely a TIA. EKG with normal sinus rhythm.  11/12: symptoms fully resolved. ECHO with EF 55%, septal hypertrophy, no wall motion abnormalities. Elevated lipids (LDL 157, HDL 32, TG 278), Z6X 5.2. Carotid U/S without stenosis. Telemetry with occasional PVCs, no arrhythmias.  -dc with statin, ASA 325, and study drug per neuro -will need to establish care with our clinic and f/u with lipid panel in 6 weeks, goal LDL <100 -f/u hypercoagulability labs as outpatient -consider outpatient telemetry monitor for Afib evaluation  # Alcohol abuse: Patient is a everyday drinker, 3-6 beers per day. Last reported intake was 11/10.  -etoh  level undetectable  -cont CIWA protocol  -cessation counseling/support provided  Tobacco abuse -discussed with patient the risk stroke and heart attack associated with smoking -encouraged cessation -f/u as outpatient   # DVT PPx: Lovenox  #Dispo -patient is stable from a medical perspective and can follow up as outpatient to continue risk factor stratification -plan to d/c home today     LOS: 1 day   [place] Ar 04/02/2012, 10:40 AM

## 2012-04-02 NOTE — Evaluation (Signed)
Physical Therapy Evaluation Patient Details Name: Ernest Thomas. MRN: [id number] DOB: [birth year] Today's Date: 04/02/2012 Time: 4098-1191 PT Time Calculation (min): 23 min  PT Assessment / Plan / Recommendation Clinical Impression  Pt appears to be at baseline independent for all mobility at this time.  High level balance activities are steady.  No further need for PT at this time.    PT Assessment  Patent does not need any further PT services    Follow Up Recommendations  No PT follow up          Equipment Recommendations  None recommended by PT             Pertinent Vitals/Pain 0/10      Mobility  Bed Mobility Bed Mobility: Supine to Sit;Sitting - Scoot to Edge of Bed;Sit to Supine Supine to Sit: 7: Independent Sitting - Scoot to Edge of Bed: 7: Independent Sit to Supine: 7: Independent Transfers Transfers: Sit to Stand;Stand to Sit Sit to Stand: 7: Independent Stand to Sit: 7: Independent Ambulation/Gait Ambulation/Gait Assistance: 7: Independent Ambulation Distance (Feet): 500 Feet Assistive device: None Gait Pattern: Within Functional Limits Stairs: Yes Stairs Assistance: 7: Independent       Visit Information  Last PT Received On: 04/02/12 Assistance Needed: +1    Subjective Data  Subjective: When can I get out of here Patient Stated Goal: to go home   Prior Functioning  Home Living Lives With: Family Available Help at Discharge: Family Type of Home: House Home Access: Stairs to enter Secretary/administrator of Steps: 2 Home Layout: One level Bathroom Shower/Tub: Teacher, adult education: None Additional Comments: Pt feels alll symptoms have resolved Prior Function Level of Independence: Independent Able to Take Stairs?: Yes Vocation: Unemployed Communication Communication: No difficulties    Cognition  Overall Cognitive Status: Appears within functional limits for tasks assessed/performed Arousal/Alertness:  Awake/alert Orientation Level: Appears intact for tasks assessed Behavior During Session: [facility] for tasks performed    Extremity/Trunk Assessment Right Upper Extremity Assessment RUE ROM/Strength/Tone: [facility] for tasks assessed Left Upper Extremity Assessment LUE ROM/Strength/Tone: [facility] for tasks assessed Right Lower Extremity Assessment RLE ROM/Strength/Tone: [facility] for tasks assessed Left Lower Extremity Assessment LLE ROM/Strength/Tone: [facility] for tasks assessed Trunk Assessment Trunk Assessment: Normal   Balance Balance Balance Assessed: Yes High Level Balance High Level Balance Activites: Side stepping;Backward walking;Direction changes;Turns;Sudden stops;Head turns High Level Balance Comments: WNL  End of Session PT - End of Session Equipment Utilized During Treatment: Gait belt Activity Tolerance: Patient tolerated treatment well Patient left: in bed;with call bell/phone within reach Nurse Communication: Mobility status  GP     Fabio Asa 04/02/2012, 2:30 PM  Charlotte Crumb, PT DPT  [phone]

## 2012-04-02 NOTE — Discharge Summary (Signed)
Internal Medicine [facility] Discharge Note  Name: Ernest Thomas. MRN: [id number] DOB: 13-Dec-1970 41 y.o.  Date of Admission: 04/01/2012 10:52 AM Date of Discharge: 04/02/2012 Attending Physician: Ernest Catalina, MD  Discharge Diagnosis: Principal Problem:  *Acute ischemic stroke Active Problems:  Alcohol abuse Hyperlipidemia  Discharge Medications:   Medication List     As of 04/02/2012  4:15 PM    STOP taking these medications         ibuprofen 200 MG tablet   Commonly known as: ADVIL,MOTRIN      TAKE these medications         aspirin EC 325 MG tablet   Take 1 tablet (325 mg total) by mouth daily.      INVESTIGATIONAL DRUG SIMPLE RECORD   Take 75 mg by mouth daily with breakfast.      multivitamin with minerals Tabs   Take 1 tablet by mouth daily.      pravastatin 40 MG tablet   Commonly known as: PRAVACHOL   Take 1 tablet (40 mg total) by mouth daily.         Disposition and follow-up:   Mr.[place] Ernest Thomas. was discharged from [facility] in stable condition.  At the hospital follow up visit please address the following issues  -f/u work up for hypercoagulability (labs drawn at discharge) -establish care with PCP, risk factor stratification -smoking and alcohol cessation counseling -needs a f/u appt with Dr. Pearlean Brownie in 2-3 months -continue to encourage adherence to ASA, study drug, and statin  Follow-up Appointments: Follow-up Information    Follow up with Ernest Rigg, MD. (please call to make an appointment in 2 months)    Contact information:   [address], [address] [facility] [place] [address] [phone]       Follow up with Ernest Gula, MD. (Monday November 18 at 2:45 pm)    Contact information:   [address] [place] [address] [phone]         Discharge Orders    Future Appointments: Provider: Department: Dept Phone: Center:   04/08/2012 2:45 PM Ernest Gula, MD [facility] [phone] [facility]     Future Orders Please Complete By Expires   Diet - low sodium heart healthy      Increase activity slowly         Consultations: Treatment Team:  Md Stroke, MD  Procedures Performed:  Ct Head Wo Contrast  04/01/2012  *RADIOLOGY REPORT*  Clinical Data: Right-sided weakness  CT HEAD WITHOUT CONTRAST  Technique:  Contiguous axial images were obtained from the base of the skull through the vertex without contrast.  Comparison: None.  Findings: The bony calvarium is intact.  No gross soft tissue abnormality is noted.  The ventricles are normal size and configuration.  No acute hemorrhage or space-occupying mass lesion is noted.  There is a vague area of decreased attenuation identified in the right parietal lobe which measures 2.2 x 0.9 cm best seen on image number 19 of series 2.  An early area of ischemia would be difficult to exclude based on this exam.  IMPRESSION: A vague area of decreased attenuation in the right parietal lobe may represent acute ischemia.  The need for further imaging by means of MRI can be determined on a clinical basis.  Critical Value/emergent results were called by telephone at the time of interpretation on 04/01/2012 at 1118 hours to Dr.  Reynolds, who verbally acknowledged these results.   Original Report Authenticated By: Alcide Clever, M.Ernest.    Mr [facility] Wo Contrast  04/01/2012  *RADIOLOGY REPORT*  Clinical Data:  Right arm and leg sensory symptoms.  Code stroke. Symptoms have now fully resolved.  MRI HEAD WITHOUT CONTRAST MRA HEAD WITHOUT CONTRAST  Technique:  Multiplanar, multiecho pulse sequences of the brain and surrounding structures were obtained without intravenous contrast. Angiographic images of the head were obtained using MRA technique without contrast.  Comparison:  CT head 04/01/2012  MRI HEAD  Findings:  There is a subcentimeter area of restricted diffusion in the  dorsolateral thalamus on the left (image 19 series 3, arrow) consistent with acute infarction.  No associated hemorrhage or mass effect.  There is slight premature atrophy. No significant white matter disease.  Remote lacunar infarct versus perivascular space left posterior frontal subcortical white matter.  The area of concern in the right subcortical white matter on prior CT appears unremarkable on MR.  No areas of acute or chronic hemorrhage.  No worrisome osseous lesions.  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  Microadenoma less likely.  Doubt craniopharyngioma.  Normal cerebellar tonsils and upper cervical region.  No acute sinus or mastoid disease.  Negative orbits.  IMPRESSION: Subcentimeter area of restricted diffusion in the left dorsolateral thalamus consistent with acute infarction.  No associated hemorrhage or mass effect.  No right parietal abnormality is seen.  Slight premature atrophy.  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  MRA HEAD  Findings: The internal carotid arteries are widely patent.  The basilar artery is widely patent with the vertebrals codominant. There is no proximal stenosis of the anterior, middle, or posterior cerebral arteries.  There is no intracranial aneurysm observed.  IMPRESSION: Negative MRA intracranial circulation.   Original Report Authenticated By: Davonna Belling, M.Ernest.    Mr Brain Wo Contrast  04/01/2012  *RADIOLOGY REPORT*  Clinical Data:  Right arm and leg sensory symptoms.  Code stroke. Symptoms have now fully resolved.  MRI HEAD WITHOUT CONTRAST MRA HEAD WITHOUT CONTRAST  Technique:  Multiplanar, multiecho pulse sequences of the brain and surrounding structures were obtained without intravenous contrast. Angiographic images of the head were obtained using MRA technique without contrast.  Comparison:  CT head 04/01/2012   MRI HEAD  Findings:  There is a subcentimeter area of restricted diffusion in the dorsolateral thalamus on the left (image 19 series 3, arrow) consistent with acute infarction.  No associated hemorrhage or mass effect.  There is slight premature atrophy. No significant white matter disease.  Remote lacunar infarct versus perivascular space left posterior frontal subcortical white matter.  The area of concern in the right subcortical white matter on prior CT appears unremarkable on MR.  No areas of acute or chronic hemorrhage.  No worrisome osseous lesions.  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  Microadenoma less likely.  Doubt craniopharyngioma.  Normal cerebellar tonsils and upper cervical region.  No acute sinus or mastoid disease.  Negative orbits.  IMPRESSION: Subcentimeter area of restricted diffusion in the left dorsolateral thalamus consistent with acute infarction.  No associated hemorrhage or mass effect.  No right parietal abnormality is seen.  Slight premature atrophy.  Likely  incidental pars intermedia Rathke's cleft cyst, 3 x 5 x 3 mm.  MRA HEAD  Findings: The internal carotid arteries are widely patent.  The basilar artery is widely patent with the vertebrals codominant. There is no proximal stenosis of the anterior, middle, or posterior cerebral arteries.  There is no intracranial aneurysm observed.  IMPRESSION: Negative MRA intracranial circulation.   Original Report Authenticated By: Davonna Belling, M.Ernest.     2D Echo: Please see full report from 04/01/12  Study Conclusions  Left ventricle: The cavity size was normal. There was mild focal basal hypertrophy of the septum. Systolic function was normal. The estimated ejection fraction was in the range of 55% to 60%. Wall motion was normal; there were no regional wall motion abnormalities. Transthoracic echocardiography. M-mode,  complete 2D, spectral Doppler, and color Doppler. Blood pressure: 128/78. Patient status: Inpatient. Location: Echo laboratory.    Admission HPI:  Mr. Idler is a 41 yo gentleman with a history of alcohol abuse, long time absence from medical care, who presents with acute onset R sided numbness. He states that this began his symptoms began this morning around 9:30 am while watching TV. He noticed his R arm, leg, R side of face and R neck was numb and "felt funny." He says he may have had some weakness as well but he is not sure. These symptoms occurred on and off, fully resolving in between. He did not notice any alleviating or exacerbating symptoms, did not seem positional or activity related. His symptoms have now resolved.   He did have an episode with similar symptoms several months ago which resolved within 10 min. He did not seek medical attention at that time.   He denies having a PMHx besides coming to the hospital as a child for "fever." He has not seen a physician since he was a teenager.   No family history of stroke or MI that he knows of. His mother died of cancer and she also had blood clots. His father has a "hernia in his heart" but he doesn't know of any other conditions.   He smokes 1/3 ppd, drinks 3-6 beers per day. Last alcohol intake was yesterday (4 beers). Denies use of marijuana, cocaine, illegal drugs. He lives with his father and uncle, is out of work currently, used to work [employer] representative.   ROS negative for chest pain, SOB, headache, cough, fever, chills, N/V, changes in vision. No recent stressors at home, no changes in his mood.  ROS positive for toothache.   Hospital Course by problem list: Principal Problem:  *Acute ischemic stroke Active Problems:  Alcohol abuse   Acute ischemic R thalamic stroke Patient presented with sudden onset R sided leg, arm, face, and neck numbness and tingling. Head CT on admission 04/01/12 did not show hemorrhagic stroke.  Subsequent MRI revealed infarct of the L dorsolateral thalamus. Patient had not visited a hospital or seen a physician for a check up in many years (>20). Neurology was consulted in ED, patient was started with aspirin and Plavix vs Placebo (as he was enrolled in the POINT trial). EKG showed no ischemic changes and troponins were negative. EKG with normal sinus rhythm. His numbness had resolved by the time he arrive in the ED. Stroke work up included the following: ECHO with EF 55%, septal hypertrophy, no wall motion abnormalities; FLP with LDL 157, HDL 32, TG 278, A1c was 5.2; Carotid U/S without stenosis. Telemetry with occasional PVCs, no arrhythmias.  Patient was seen by the stroke team,  enrolled in the POINT trail, started on aspirin and plavix vs placebo according to the trial. He was given high-dose statin and then discharged with a rx for statin. Work up for hypercoagulability were drawn on the day of discharge, but this work up should be completed as an outpatient.  He will establish care in our internal medicine clinic downstairs.   Alcohol abuse: Patient is a everyday drinker, 3-6 beers per day. Last reported intake was the day before admission, etoh level was undetectable on admission. He was started on CIWA protocol but did not show any signs of withdrawal.  Tobacco abuse  Patient is a current every day smoker. It was discussed on multiple occasions the risk of stroke and heart attack associated with smoking and we strongly recommended cessation. We can continue to do this in outpatient setting   Discharge Vitals:  BP 126/78  Pulse 62  Temp 97.5 F (36.4 C) (Oral)  Resp 18  Ht 5\' 9"  (1.753 m)  Wt 151 lb 10.8 oz (68.8 kg)  BMI 22.40 kg/m2  SpO2 96%  Discharge Labs:  Results for orders placed during the hospital encounter of 04/01/12 (from the past 24 hour(s))  ETHANOL     Status: Normal   Collection Time   04/01/12  6:33 PM      Component Value Range   Alcohol, Ethyl (B) <11  0  - 11 mg/dL  TSH     Status: Normal   Collection Time   04/01/12  6:33 PM      Component Value Range   TSH 1.298  0.350 - 4.500 uIU/mL  LIPID PANEL     Status: Abnormal   Collection Time   04/02/12  6:48 AM      Component Value Range   Cholesterol 265 (*) 0 - 200 mg/dL   Triglycerides 454 (*) <150 mg/dL   HDL 32 (*) >09 mg/dL   Total CHOL/HDL Ratio 8.3     VLDL 76 (*) 0 - 40 mg/dL   LDL Cholesterol 811 (*) 0 - 99 mg/dL  COMPREHENSIVE METABOLIC PANEL     Status: Abnormal   Collection Time   04/02/12  6:48 AM      Component Value Range   Sodium 143  135 - 145 mEq/L   Potassium 4.1  3.5 - 5.1 mEq/L   Chloride 110  96 - 112 mEq/L   CO2 25  19 - 32 mEq/L   Glucose, Bld 97  70 - 99 mg/dL   BUN 7  6 - 23 mg/dL   Creatinine, Ser 9.14  0.50 - 1.35 mg/dL   Calcium 8.4  8.4 - 78.2 mg/dL   Total Protein 5.6 (*) 6.0 - 8.3 g/dL   Albumin 3.0 (*) 3.5 - 5.2 g/dL   AST 29  0 - 37 U/L   ALT 21  0 - 53 U/L   Alkaline Phosphatase 72  39 - 117 U/L   Total Bilirubin 0.5  0.3 - 1.2 mg/dL   GFR calc non Af Amer >90  >90 mL/min   GFR calc Af Amer >90  >90 mL/min  ANTITHROMBIN III     Status: Normal   Collection Time   04/02/12  1:25 PM      Component Value Range   AntiThromb III Func 110  75 - 120 %  SEDIMENTATION RATE     Status: Normal   Collection Time   04/02/12  1:25 PM      Component Value Range  Sed Rate 2  0 - 16 mm/hr    Signed: Denton Ar 04/02/2012, 4:15 PM   Time Spent on Discharge: 30 Services Ordered on Discharge: none Equipment Ordered on Discharge: none

## 2012-04-02 NOTE — Discharge Summary (Signed)
Pt d/c home d/c instruction  Given  Point trial medication picked up from pharmacy and given with instruction pt TIA and stroke education completed.with smoking cessation / alcohol intake re enforced ,pt verbalized Understanding

## 2012-04-02 NOTE — Evaluation (Signed)
Occupational Therapy Evaluation Patient Details Name: Ernest Thomas. MRN: [id number] DOB: [birth year] Today's Date: 04/02/2012 Time: 4098-1191 OT Time Calculation (min): 32 min  OT Assessment / Plan / Recommendation Clinical Impression  Pt presents to OT s/p admission with tingling in BUE. Pt now back to baseline and is not having any other symptoms . No further OT needed     OT Assessment  Patient does not need any further OT services    Follow Up Recommendations  No OT follow up       Equipment Recommendations  None recommended by OT                 ADL  Eating/Feeding: Performed;Independent Where Assessed - Eating/Feeding: Edge of bed Grooming: Simulated;Wash/dry hands;Wash/dry face Where Assessed - Grooming: Unsupported standing Upper Body Bathing: Simulated;Independent Where Assessed - Upper Body Bathing: Unsupported sit to stand;Unsupported sitting Lower Body Bathing: Simulated;Independent Where Assessed - Lower Body Bathing: Unsupported sit to stand Upper Body Dressing: Simulated;Independent Where Assessed - Upper Body Dressing: Unsupported sit to stand Lower Body Dressing: Independent Where Assessed - Lower Body Dressing: Unsupported sit to stand Toilet Transfer: Performed;Independent Toilet Transfer Method: Sit to stand        Visit Information  Last OT Received On: 04/02/12 Assistance Needed: +1    Subjective Data  Subjective: i am back to normal- no more tingling Patient Stated Goal: go home   Prior Functioning     Home Living Lives With: Family Available Help at Discharge: Family Type of Home: House Home Layout: One level Bathroom Shower/Tub: Teacher, adult education: None Additional Comments: Pt feels alll symptoms have resolved Prior Function Level of Independence: Independent Vocation: Unemployed Communication Communication: No difficulties            Cognition  Overall Cognitive Status: Appears within  functional limits for tasks assessed/performed Arousal/Alertness: Awake/alert Orientation Level: Appears intact for tasks assessed Behavior During Session: [facility] for tasks performed    Extremity/Trunk Assessment Right Upper Extremity Assessment RUE ROM/Strength/Tone: [facility] for tasks assessed Left Upper Extremity Assessment LUE ROM/Strength/Tone: WFL for tasks assessed     Mobility Bed Mobility Bed Mobility: Supine to Sit Supine to Sit: 7: Independent Transfers Transfers: Sit to Stand;Stand to Sit Sit to Stand: 7: Independent Stand to Sit: 7: Independent              End of Session OT - End of Session Activity Tolerance: Patient tolerated treatment well Patient left: in chair;with call bell/phone within reach       [facility], Ernest Thomas 04/02/2012, 1:00 PM

## 2012-04-02 NOTE — Progress Notes (Signed)
Stroke Team Progress Note  HISTORY Ernest Thomas is an 41 y.o. male who had awoken at 76 am 04/01/2012. Initially he was at his baseline. He was watching T.V. When at 9:30 am he noted his right arm and leg was tingling. He tried to get up and found walking was difficult due to decreased sensation--not strength. Patient called EMS and was brought to [facility] hospital as a code stroke. By the time he arrived patients symptoms had fully resolved. CT head was negative for acute mass, bleed or infarct with some question of decreased attenuation in the right parietal lobe--which would not corrilate with right sided symptoms. At present time patient is back to his baseline with no focal localizing or lateralizing neurological findings. NIHSS 0.   Of note: Patient had similar symptoms one month ago which lasted for 10 minutes and resolved. He did not seek attention at that time.   Patient was not a TPA candidate secondary to resolution of symptoms. He was admitted for further evaluation and treatment.  SUBJECTIVE No family is at the bedside.  Overall he feels his condition is completely resolved.   OBJECTIVE Most recent Vital Signs: Filed Vitals:   04/01/12 2200 04/01/12 2251 04/02/12 0200 04/02/12 0600  BP: 118/84 118/84 118/86 132/86  Pulse: 64 64 64 65  Temp: 97.6 F (36.4 C) 97.6 F (36.4 C) 97.5 F (36.4 C) 97.7 F (36.5 C)  TempSrc: Oral Oral Oral Oral  Resp: 20 20 20 20   Height:  5\' 9"  (1.753 m)    Weight:  69.117 kg (152 lb 6 oz)  68.8 kg (151 lb 10.8 oz)  SpO2: 98% 98% 98% 98%   CBG (last 3)   Basename 04/01/12 1100  GLUCAP 107*    IV Fluid Intake:     . sodium chloride 1,000 mL (04/02/12 0446)    MEDICATIONS    . [COMPLETED] aspirin      . aspirin EC  81 mg Oral Daily  . atorvastatin  80 mg Oral q1800  . enoxaparin (LOVENOX) injection  40 mg Subcutaneous Q24H  . folic acid  1 mg Oral Daily  . multivitamin with minerals  1 tablet Oral Daily  . Point Trial - clopidogrel  / placebo (daily dose)  (PI-Sethi)  75 mg Oral Q breakfast  . [COMPLETED] Point Trial - clopidogrel / placebo (now dose)  (PI-Sethi)  600 mg Oral NOW  . sodium chloride  3 mL Intravenous Q12H  . sodium chloride  3 mL Intravenous Q12H  . thiamine  100 mg Oral Daily   Or  . thiamine  100 mg Intravenous Daily  . [DISCONTINUED] aspirin EC  325 mg Oral Daily  . [DISCONTINUED] LORazepam  0-4 mg Intravenous Q6H  . [DISCONTINUED] LORazepam  0-4 mg Intravenous Q12H   PRN:  sodium chloride, acetaminophen, acetaminophen, LORazepam, LORazepam, sodium chloride  Diet:  Cardiac thin liquids Activity:  Bedrest DVT Prophylaxis:  Lovenox 40 mg sq daily   CLINICALLY SIGNIFICANT STUDIES Basic Metabolic Panel:  Lab 04/02/12 1610 04/01/12 1108 04/01/12 1105  NA 143 144 --  K 4.1 3.8 --  CL 110 107 --  CO2 25 -- 27  GLUCOSE 97 94 --  BUN 7 7 --  CREATININE 0.95 1.20 --  CALCIUM 8.4 -- 9.3  MG -- -- --  PHOS -- -- --   Liver Function Tests:  Lab 04/02/12 0648  AST 29  ALT 21  ALKPHOS 72  BILITOT 0.5  PROT 5.6*  ALBUMIN 3.0*  CBC:  Lab 04/01/12 1108 04/01/12 1105  WBC -- 8.7  NEUTROABS -- --  HGB 16.7 15.4  HCT 49.0 46.2  MCV -- 109.2*  PLT -- 237   Coagulation:  Lab 04/01/12 1105  LABPROT 11.8  INR 0.87   Cardiac Enzymes:  Lab 04/01/12 1105  CKTOTAL --  CKMB --  CKMBINDEX --  TROPONINI <0.30   Urinalysis: No results found for this basename: COLORURINE:2,APPERANCEUR:2,LABSPEC:2,PHURINE:2,GLUCOSEU:2,HGBUR:2,BILIRUBINUR:2,KETONESUR:2,PROTEINUR:2,UROBILINOGEN:2,NITRITE:2,LEUKOCYTESUR:2 in the last 168 hours Lipid Panel    Component Value Date/Time   CHOL 265* 04/02/2012 0648   TRIG 378* 04/02/2012 0648   HDL 32* 04/02/2012 0648   CHOLHDL 8.3 04/02/2012 0648   VLDL 76* 04/02/2012 0648   LDLCALC 157* 04/02/2012 0648   HgbA1C  Lab Results  Component Value Date   HGBA1C 5.2 04/01/2012    Urine Drug Screen:   No results found for this basename: labopia, cocainscrnur,  labbenz, amphetmu, thcu, labbarb    Alcohol Level:  Lab 04/01/12 1833  ETH <11   CT of the brain  04/01/2012  A vague area of decreased attenuation in the right parietal lobe may represent acute ischemia.  The need for further imaging by means of MRI can be determined on a clinical basis.    MRI of the brain  04/01/2012 Subcentimeter area of restricted diffusion in the left dorsolateral thalamus consistent with acute infarction.  No associated hemorrhage or mass effect.  No right parietal abnormality is seen.  Slight premature atrophy.  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   MRA of the brain  04/01/2012  Negative MRA intracranial circulation.    2D Echocardiogram  EF 55-60% with no source of embolus.   Carotid Doppler  No evidence of hemodynamically significant internal carotid artery stenosis. Vertebral artery flow is antegrade.  CXR    EKG  normal sinus rhythm, nonspecific ST and T waves changes, RBBB.   Therapy Recommendations PT - ; OT - ; ST -   Physical Exam   Pleasant frail middle aged caucasian male not in distress.Awake alert. Afebrile. Head is nontraumatic. Neck is supple without bruit. Hearing is normal. Cardiac exam no murmur or gallop. Lungs are clear to auscultation. Distal pulses are well felt.  Neurological Exam ; awake alert oriented to time place and person. Speech and language appear normal. Extraocular moments are full range without nystagmus. Fundi were not visualized. Vision acuity and fields appear normal. Face is symmetric without weakness. Motor system exam reveals no upper or lower expected drift but fine finger movements are diminished on the right. He orbits left over right upper extremity. There is subjective paresthesias on the right side but no objective sensory loss. Coordination is slow but accurate. Gait was not tested. ASSESSMENT Mr. Ernest Thomas. is a 41 y.o. male presenting with difficulty walking due to decreased  sensation. Imaging confirms a left dorsolateral thalamic infarct. Infarct felt to be thrombotic secondary to small vessel disease, though given young age, cannot rule out hypercoagulable source.  Work up underway. On no antiplatelets prior to admission. Now on aspirin 325 mg orally every day and POINT study drug for secondary stroke prevention. Patient with no resultant neuro symptoms.  Hyperlipidemia, LDL 157, not on statin PTA, now on lipitor, goal LDL < 100   Hospital day # 1  TREATMENT/PLAN  Continue  aspirin 325 mg orally every day for secondary stroke prevention in addition to POINT study drug Patient is enrollled in POINT Trial - POINT  is a randomized, double-blind, multicenter clinical trial to determine whether clopidogrel 75mg /day (after a loading dose of 600mg ) is effective in improving survival free from major ischemic vascular events (ischemic stroke, myocardial infarction, and ischemic vascular death) at 90 days when initiated within 12 hours time last known free of new ischemic symptoms of TIA or minor ischemic stroke in subjects receiving aspirin 50-325mg /day. Please contact Lorenza Burton at [facility] at [phone] for any questions. Please have RN pick up study drug from the main pharmacy and send home with him (I wrote a nursing order) Labs to look for cause of stroke in young:   Hypercoagulable panel (except Factor V Leiden & Beta-2-glycoprotein, which are both associated with venous not arterial infarcts), Vasculitic labs (C3, C4, CH50, ESR, ANA) HIV & RPR Can also consider outpatient telemetry monitoring to assess patient for atrial fibrillation as source of stroke. May be arranged with patient's cardiologist, or cardiologist of choice.  OOB. Therapy evals  Annie Main, MSN, RN, ANVP-BC, ANP-BC, GNP-BC [facility] Pager: [phone] 04/02/2012 8:48 AM  Scribe for Dr. Delia Heady, Stroke Center Medical Director, who has personally  reviewed chart, pertinent data, examined the patient and developed the plan of care. Pager:  [phone]

## 2012-04-02 NOTE — H&P (Signed)
Internal Medicine Teaching Service Attending Note Date: 04/02/2012  Patient name: Ernest Thomas.  Medical record number: [id number]  Date of birth: 14-Oct-1970   I have seen and evaluated Ernest Thomas. and discussed their care with the Residency Team.    Ernest. Thomas is a 41 yo man with no PMH due to limited interaction with healthcare who presented to the [facility] ED with complaints of right sided numbness.  He reports the symptoms started on the morning of admission while watching his tv.  He noted that his right arm, leg, face and neck felt numb and "funny feeling."  He cannot tell if he had weakness, but did have difficulty walking 2/2 the numbness.  The symptoms came and went and completely resolved when gone. He did not notice any specific alleviating or aggravating factors.  When I saw him, his symptoms had resolved. He had one prior episode several months ago which resolved within 10 minutes.   He has not seen a doctor since being a teenager, he is heavy drinker.  He does not have a FH of stroke or MI.  He also smokes about 1/3 ppd.   Other PMH, PSH, meds/allergies, ROS per resident note.   Physical Exam: Blood pressure 126/78, pulse 62, temperature 97.5 F (36.4 C), temperature source Oral, resp. rate 18, height 5\' 9"  (1.753 m), weight 151 lb 10.8 oz (68.8 kg), SpO2 96.00%. General appearance: alert, cooperative and no distress Head: Normocephalic, without obvious abnormality, atraumatic Eyes: EOMI, PERRL Lungs: clear to auscultation bilaterally Heart: RR, NR, no murmur noted Abdomen: soft, non-tender; bowel sounds normal Extremities: warm, dry, no edema Pulses: 2+ and symmetric Neurologic: Mental status: Alert, oriented, thought content appropriate Cranial nerves: II: pupils equal, round, reactive to light and accommodation, III,IV,VI: extraocular muscles extra-ocular motions intact, VII: upper facial muscle function normal bilaterally, VII: lower facial muscle function normal  bilaterally, IX: soft palate elevation normal in midline, XI: trapezius strength normal bilaterally, XI: sternocleidomastoid strength normal bilaterally.  Sensation equal in UE, LE on my exam bilaterally to light touch.  Strength 5/5 throughout.   Lab results: Results for orders placed during the Thomas encounter of 04/01/12 (from the past 24 hour(s))  ETHANOL     Status: Normal   Collection Time   04/01/12  6:33 PM      Component Value Range   Alcohol, Ethyl (B) <11  0 - 11 mg/dL  TSH     Status: Normal   Collection Time   04/01/12  6:33 PM      Component Value Range   TSH 1.298  0.350 - 4.500 uIU/mL  LIPID PANEL     Status: Abnormal   Collection Time   04/02/12  6:48 AM      Component Value Range   Cholesterol 265 (*) 0 - 200 mg/dL   Triglycerides 409 (*) <150 mg/dL   HDL 32 (*) >81 mg/dL   Total CHOL/HDL Ratio 8.3     VLDL 76 (*) 0 - 40 mg/dL   LDL Cholesterol 191 (*) 0 - 99 mg/dL  COMPREHENSIVE METABOLIC PANEL     Status: Abnormal   Collection Time   04/02/12  6:48 AM      Component Value Range   Sodium 143  135 - 145 mEq/L   Potassium 4.1  3.5 - 5.1 mEq/L   Chloride 110  96 - 112 mEq/L   CO2 25  19 - 32 mEq/L   Glucose, Bld 97  70 - 99  mg/dL   BUN 7  6 - 23 mg/dL   Creatinine, Ser 1.61  0.50 - 1.35 mg/dL   Calcium 8.4  8.4 - 09.6 mg/dL   Total Protein 5.6 (*) 6.0 - 8.3 g/dL   Albumin 3.0 (*) 3.5 - 5.2 g/dL   AST 29  0 - 37 U/L   ALT 21  0 - 53 U/L   Alkaline Phosphatase 72  39 - 117 U/L   Total Bilirubin 0.5  0.3 - 1.2 mg/dL   GFR calc non Af Amer >90  >90 mL/min   GFR calc Af Amer >90  >90 mL/min  ANTITHROMBIN III     Status: Normal   Collection Time   04/02/12  1:25 PM      Component Value Range   AntiThromb III Func 110  75 - 120 %    Imaging results:  Ct Head Wo Contrast  04/01/2012  *RADIOLOGY REPORT*  Clinical Data: Right-sided weakness  CT HEAD WITHOUT CONTRAST  Technique:  Contiguous axial images were obtained from the base of the skull through the  vertex without contrast.  Comparison: None.  Findings: The bony calvarium is intact.  No gross soft tissue abnormality is noted.  The ventricles are normal size and configuration.  No acute hemorrhage or space-occupying mass lesion is noted.  There is a vague area of decreased attenuation identified in the right parietal lobe which measures 2.2 x 0.9 cm best seen on image number 19 of series 2.  An early area of ischemia would be difficult to exclude based on this exam.  IMPRESSION: A vague area of decreased attenuation in the right parietal lobe may represent acute ischemia.  The need for further imaging by means of MRI can be determined on a clinical basis.  Critical Value/emergent results were called by telephone at the time of interpretation on 04/01/2012 at 1118 hours to Dr. Thad Ranger, who verbally acknowledged these results.   Original Report Authenticated By: Alcide Clever, M.D.    Ernest Thomas Wo Contrast  04/01/2012  *RADIOLOGY REPORT*  Clinical Data:  Right arm and leg sensory symptoms.  Code stroke. Symptoms have now fully resolved.  MRI HEAD WITHOUT CONTRAST MRA HEAD WITHOUT CONTRAST  Technique:  Multiplanar, multiecho pulse sequences of the brain and surrounding structures were obtained without intravenous contrast. Angiographic images of the head were obtained using MRA technique without contrast.  Comparison:  CT head 04/01/2012  MRI HEAD  Findings:  There is a subcentimeter area of restricted diffusion in the dorsolateral thalamus on the left (image 19 series 3, arrow) consistent with acute infarction.  No associated hemorrhage or mass effect.  There is slight premature atrophy. No significant white matter disease.  Remote lacunar infarct versus perivascular space left posterior frontal subcortical white matter.  The area of concern in the right subcortical white matter on prior CT appears unremarkable on Ernest.  No areas of acute or chronic hemorrhage.  No worrisome osseous lesions.  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  Microadenoma less likely.  Doubt craniopharyngioma.  Normal cerebellar tonsils and upper cervical region.  No acute sinus or mastoid disease.  Negative orbits.  IMPRESSION: Subcentimeter area of restricted diffusion in the left dorsolateral thalamus consistent with acute infarction.  No associated hemorrhage or mass effect.  No right parietal abnormality is seen.  Slight premature atrophy.  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  MRA HEAD  Findings: The internal carotid arteries are widely patent.  The basilar artery is widely patent with the vertebrals codominant. There is no proximal stenosis of the anterior, middle, or posterior cerebral arteries.  There is no intracranial aneurysm observed.  IMPRESSION: Negative MRA intracranial circulation.   Original Report Authenticated By: Davonna Belling, M.D.    Ernest Brain Wo Contrast  04/01/2012  *RADIOLOGY REPORT*  Clinical Data:  Right arm and leg sensory symptoms.  Code stroke. Symptoms have now fully resolved.  MRI HEAD WITHOUT CONTRAST MRA HEAD WITHOUT CONTRAST  Technique:  Multiplanar, multiecho pulse sequences of the brain and surrounding structures were obtained without intravenous contrast. Angiographic images of the head were obtained using MRA technique without contrast.  Comparison:  CT head 04/01/2012  MRI HEAD  Findings:  There is a subcentimeter area of restricted diffusion in the dorsolateral thalamus on the left (image 19 series 3, arrow) consistent with acute infarction.  No associated hemorrhage or mass effect.  There is slight premature atrophy. No significant white matter disease.  Remote lacunar infarct versus perivascular space left posterior frontal subcortical white matter.  The area of concern in the right subcortical white matter on prior CT appears unremarkable on Ernest.  No  areas of acute or chronic hemorrhage.  No worrisome osseous lesions.  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  Microadenoma less likely.  Doubt craniopharyngioma.  Normal cerebellar tonsils and upper cervical region.  No acute sinus or mastoid disease.  Negative orbits.  IMPRESSION: Subcentimeter area of restricted diffusion in the left dorsolateral thalamus consistent with acute infarction.  No associated hemorrhage or mass effect.  No right parietal abnormality is seen.  Slight premature atrophy.  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  MRA HEAD  Findings: The internal carotid arteries are widely patent.  The basilar artery is widely patent with the vertebrals codominant. There is no proximal stenosis of the anterior, middle, or posterior cerebral arteries.  There is no intracranial aneurysm observed.  IMPRESSION: Negative MRA intracranial circulation.   Original Report Authenticated By: Davonna Belling, M.D.     Assessment and Plan: I agree with the formulated Assessment and Plan with the following changes:   1. Thalamic Ischemia stroke - TTE, carotid dopplers - Patient a code stroke, so Neurology is involved - Cycle cardiac enzymes - Aspirin, on POINT research trial - plavix vs. Placebo - Risk stratification with A1C, FLP, TSH, UDS - PT/OT - NPO with swallow study - Coagulation panel due to patients young age - can be followed up outpatient  2. ETOH intake - CIWA protocol  Other issues per resident note.   Inez Catalina, MD 11/12/20132:48 PM

## 2012-04-03 LAB — CARDIOLIPIN ANTIBODIES, IGG, IGM, IGA
Anticardiolipin IgG: 3 GPL U/mL — ABNORMAL LOW (ref <23)
Anticardiolipin IgM: 0 MPL U/mL — ABNORMAL LOW (ref <11)

## 2012-04-03 LAB — LUPUS ANTICOAGULANT PANEL
Lupus Anticoagulant: NOT DETECTED
dRVVT Incubated 1:1 Mix: 38.7 secs (ref <42.9)

## 2012-04-03 LAB — PROTEIN S ACTIVITY: Protein S Activity: 120 % (ref 69–129)

## 2012-04-03 NOTE — Care Management Note (Signed)
    Page 1 of 1   04/03/2012     8:24:20 AM   CARE MANAGEMENT NOTE 04/03/2012  Patient:  Ernest Thomas, Ernest Thomas   Account Number:  [id number]  Date Initiated:  04/02/2012  Documentation initiated by:  [facility]  Subjective/Objective Assessment:   Admitted with left sided weakness, TIA.     Action/Plan:   PT/OT evals-no d/c needs identified   Anticipated DC Date:  04/02/2012   Anticipated DC Plan:  HOME/SELF CARE      DC Planning Services  CM consult      Choice offered to / List presented to:             Status of service:  Completed, signed off Medicare Important Message given?   (If response is "NO", the following Medicare IM given date fields will be blank) Date Medicare IM given:   Date Additional Medicare IM given:    Discharge Disposition:  HOME/SELF CARE  Per UR Regulation:  Reviewed for med. necessity/level of care/duration of stay  If discussed at Long Length of Stay Meetings, dates discussed:    Comments:  04/03/12 Spoke with patient about d/c plans on 04/02/12. He lives with his father and uncle, plans to f/u with internal medicine. Gave patient resource information and pharmacy discount card. Discharged to home 04/02/12. Jacquelynn Cree RN, BSN, CCM

## 2012-04-05 LAB — PROTEIN C, TOTAL: Protein C, Total: 86 % (ref 72–160)

## 2012-04-08 ENCOUNTER — Ambulatory Visit (INDEPENDENT_AMBULATORY_CARE_PROVIDER_SITE_OTHER): Payer: Self-pay | Admitting: Internal Medicine

## 2012-04-08 ENCOUNTER — Ambulatory Visit: Payer: Self-pay | Admitting: Internal Medicine

## 2012-04-08 ENCOUNTER — Encounter: Payer: Self-pay | Admitting: Internal Medicine

## 2012-04-08 VITALS — BP 124/87 | HR 70 | Temp 97.9°F | Ht 69.0 in | Wt 156.4 lb

## 2012-04-08 DIAGNOSIS — K029 Dental caries, unspecified: Secondary | ICD-10-CM | POA: Insufficient documentation

## 2012-04-08 DIAGNOSIS — I6350 Cerebral infarction due to unspecified occlusion or stenosis of unspecified cerebral artery: Secondary | ICD-10-CM

## 2012-04-08 DIAGNOSIS — F1010 Alcohol abuse, uncomplicated: Secondary | ICD-10-CM

## 2012-04-08 DIAGNOSIS — I639 Cerebral infarction, unspecified: Secondary | ICD-10-CM

## 2012-04-08 DIAGNOSIS — E785 Hyperlipidemia, unspecified: Secondary | ICD-10-CM

## 2012-04-08 DIAGNOSIS — F17200 Nicotine dependence, unspecified, uncomplicated: Secondary | ICD-10-CM

## 2012-04-08 DIAGNOSIS — Z0000 Encounter for general adult medical examination without abnormal findings: Secondary | ICD-10-CM

## 2012-04-08 DIAGNOSIS — Z720 Tobacco use: Secondary | ICD-10-CM | POA: Insufficient documentation

## 2012-04-08 MED ORDER — ASPIRIN EC 325 MG PO TBEC: 325.0000 mg | DELAYED_RELEASE_TABLET | Freq: Every day | ORAL | Status: DC

## 2012-04-08 NOTE — Assessment & Plan Note (Signed)
Pt's presenting sxs were R sided numbness and R sided weakness. These sxs resolved in the hospital and have not recurred. He was enrolled in the Point trial - to take ASA 325 and study drug. To make appt with Dr Pearlean Brownie in 2 months - I encouraged them to go ahead and make the appt now. He is taking all meds in the AM at 6 after breakfast. No sides effects. He is cutting back on smoking and wants to slowly taper off like his uncle. He is also decreasing his ETOH use. We encouraged healthy eating and provided verbal and written instructions. He declined seeing our nutritionist and a smoking counselor. His LDL was elevated so we will see him back in 2 months to recheck a FLP.

## 2012-04-08 NOTE — Progress Notes (Signed)
Subjective:     Patient ID: Ernest Thomas., male   DOB: 06/12/1970, 41 y.o.   MRN: [id number]  HPI 41 year old male presents for a hospital follow up visit after being diagnosed with an acute ischemic stroke. Patient presented initially for right sided weakness and numbness. After his acute event, he states he currently has no deficiencies 2/2 to his stroke and he is back to his baseline. Patient was put on a stroke treatment trial mediation, along with pravastatin, aspirin, and a multivitamin which he has been taking regularly. He complained of 1 episode of nausea/vomiting after discharge (on 04/01/12), but no other events of nausea/vomiting and no other complaints.  Past Medical History  Diagnosis Date  . Alcohol abuse     4 DUI, lost license many times  . Stroke 2013    Acute left dorsolateral thalamus ischemic CVA, no residual sxs.  . Hyperlipidemia    History reviewed. No pertinent past surgical history.  Family history: Mother: DM, stroke, ovarian cancer Father: benign Aunt (mother's side): DM  Social history: Employment: Currently unemployed. Past Corporate investment banker Smoking: Current smoker. Has decreased to 1/3 ppd. Began smoking at age 63. Alcohol: Chronic drinker. Has decreased to 1 beer/day. History of 4 DUIs (license revoked x 3) Illicit drugs: marijuana use in past. Not currently abusing any drugs. Denies heroin, cocaine.  Review of Systems  Constitutional: Negative for activity change, appetite change and fatigue.  HENT: Negative for rhinorrhea, neck pain and neck stiffness.   Eyes: Negative for visual disturbance.  Respiratory: Negative for cough, chest tightness, shortness of breath and wheezing.   Cardiovascular: Negative for chest pain.  Genitourinary: Negative for dysuria, urgency, frequency, hematuria and difficulty urinating.  Musculoskeletal: Negative for myalgias, back pain, joint swelling, arthralgias and gait problem.  Neurological: Positive for speech  difficulty (chronic (not 2/2 to stroke)). Negative for dizziness, facial asymmetry, weakness, numbness and headaches.  Psychiatric/Behavioral: Negative for sleep disturbance and agitation. The patient is not nervous/anxious.        Objective:   Physical Exam  Constitutional: He is oriented to person, place, and time. No distress.  HENT:  Head: Normocephalic and atraumatic.  Mouth/Throat: Abnormal dentition (poor dentition).  Eyes: Pupils are equal, round, and reactive to light. Right eye exhibits no discharge. Left eye exhibits no discharge. No scleral icterus.  Neck: Normal range of motion. No JVD present.  Cardiovascular: Normal rate, regular rhythm and intact distal pulses.  Exam reveals no gallop and no friction rub.   No murmur heard. Pulmonary/Chest: Effort normal and breath sounds normal. No respiratory distress. He has no wheezes. He has no rales. He exhibits no tenderness.  Abdominal: Soft. Bowel sounds are normal. He exhibits no distension. There is no tenderness. There is no rebound and no guarding.  Musculoskeletal: Normal range of motion. He exhibits no edema and no tenderness.  Lymphadenopathy:    He has no cervical adenopathy.  Neurological: He is alert and oriented to person, place, and time. No cranial nerve deficit.  Skin: No rash noted. He is not diaphoretic. No erythema.  Psychiatric: He has a normal mood and affect.   Filed Vitals:   04/08/12 0913  BP: 124/87  Pulse: 70  Temp: 97.9 F (36.6 C)   Lipid Panel     Component Value Date/Time   CHOL 265* 04/02/2012 0648   TRIG 378* 04/02/2012 0648   HDL 32* 04/02/2012 0648   CHOLHDL 8.3 04/02/2012 0648   VLDL 76* 04/02/2012 1478  LDLCALC 157* 04/02/2012 4782       Assessment:     41 year old male with no PMH was recently discharged from [facility] hospital for an acute stroke and presents today for a hospital follow up visit. Patient has a history of smoking and poor diet. 1. Recent history of stroke - Patient  currently in a stroke treatment trial on an unknown medication along with pravastatin, ASA, and multivitamin 2. Abnormal lipid panel 3. History of smoking 4. Alcohol abuse 5. Poor dentition    Plan:     1. Continue treatment plan as per Neurology/stroke trial - follow up visit with Dr. Pearlean Brownie in 2-3 months. - Patient given booklet on stroke education.  2. Diet modification for abnormal lipid panel. Patient was given 2 booklets regarding a healthier diet. Patient was advised against drinking soft drinks, eating fast food, eating fried foods. - follow up with OCP to check lipids since patient is now on statins.  3. Patient was advised to stop smoking. He is currently trying to quit smoking and has decreased his smoking to 1/3 packs per day. He has a goal of quitting permanently, but acknowledges it will take time. Patient was given a booklet explaining reasons to stop smoking and was educated on the complications of smoking, including increased stroke risk.  4. Patient was advised to stop drinking alcohol. He agreed there is a need to stop.    5. Patient was advised to see a dentist regularly. Patient refused and states he does not want to see a dentist. We explained to the patient that the poor dentition can cause numerous serious medical problems.

## 2012-04-08 NOTE — Assessment & Plan Note (Signed)
LDL was 157 on admit. He was started on a statin and is taking in the AM. Will need to return to clinic 2 months to recheck FLP. We encouraged healthy eating changes.

## 2012-04-08 NOTE — Assessment & Plan Note (Addendum)
Healthy eating, smoking and ETOH cessation, and dental care were reviewed.  F/U 2 months for LDL.

## 2012-04-08 NOTE — Patient Instructions (Addendum)
Continue taking your medicines as instructed.  We will look into the aspirin refill and let you know.   We will enter [facility] [address] as your pharmacy.  Eat healthy - fruits, veggies, whole grains, lean meats. Stay away from fried foods, sausage, hamburgers, bacon, soft drinks, Monster drinks,   Stop drinking and smoking!!!!!! Let us know if you would like any help from [place].  Let us know if you want to see a dentist or nutritionist.   See Chauncey Reading.  Make an appt with Dr Pearlean Brownie.

## 2012-04-08 NOTE — Assessment & Plan Note (Signed)
If he needs a tooth pulled, he will go to a guy he knows on [address] and pay him to pull it but he refuses a dental referral.

## 2012-04-08 NOTE — Progress Notes (Signed)
  Subjective:    Patient ID: Ernest Hacker., male    DOB: [birth year], 41 y.o.   MRN: [id number]  HPI  Pt came for HFU appt with aunt. Please see the A&P for the status of the pt's chronic medical problems.   Review of Systems  Constitutional: Negative for chills and unexpected weight change.  HENT: Positive for dental problem. Negative for hearing loss, rhinorrhea and trouble swallowing.   Eyes: Negative for itching and visual disturbance.  Respiratory: Negative for cough and shortness of breath.   Cardiovascular: Negative for chest pain and palpitations.  Gastrointestinal: Positive for nausea and vomiting. Negative for diarrhea, constipation, blood in stool and rectal pain.  Genitourinary: Negative for hematuria and difficulty urinating.  Musculoskeletal: Positive for arthralgias. Negative for gait problem.  Skin: Negative for rash and wound.  Neurological: Negative for weakness, numbness and headaches.  Psychiatric/Behavioral: Negative for sleep disturbance and dysphoric mood. The patient is not nervous/anxious.        Objective:   Physical Exam        Assessment & Plan:

## 2012-04-08 NOTE — Assessment & Plan Note (Signed)
He does not drive after drinking anymore. He does have a license. He is cutting back and currently drinks one 12 oz beer per day.

## 2012-04-08 NOTE — Assessment & Plan Note (Signed)
He has cut down to 1/2 pack per day. He refused smoking counseling and nicotine replacement substances.

## 2012-11-15 ENCOUNTER — Encounter: Payer: Self-pay | Admitting: Internal Medicine

## 2012-11-15 ENCOUNTER — Ambulatory Visit: Payer: Self-pay | Attending: Family Medicine | Admitting: Internal Medicine

## 2012-11-15 VITALS — BP 124/87 | HR 60 | Temp 97.8°F | Resp 16 | Ht 68.0 in | Wt 155.0 lb

## 2012-11-15 DIAGNOSIS — Z8673 Personal history of transient ischemic attack (TIA), and cerebral infarction without residual deficits: Secondary | ICD-10-CM | POA: Insufficient documentation

## 2012-11-15 DIAGNOSIS — E785 Hyperlipidemia, unspecified: Secondary | ICD-10-CM | POA: Insufficient documentation

## 2012-11-15 DIAGNOSIS — F1010 Alcohol abuse, uncomplicated: Secondary | ICD-10-CM | POA: Insufficient documentation

## 2012-11-15 LAB — COMPREHENSIVE METABOLIC PANEL
ALT: 32 U/L (ref 0–53)
AST: 44 U/L — ABNORMAL HIGH (ref 0–37)
Albumin: 4.1 g/dL (ref 3.5–5.2)
Alkaline Phosphatase: 96 U/L (ref 39–117)
Calcium: 9.8 mg/dL (ref 8.4–10.5)
Chloride: 109 mEq/L (ref 96–112)
Potassium: 4.8 mEq/L (ref 3.5–5.3)
Sodium: 146 mEq/L — ABNORMAL HIGH (ref 135–145)
Total Protein: 6.8 g/dL (ref 6.0–8.3)

## 2012-11-15 LAB — LIPID PANEL
Total CHOL/HDL Ratio: 7.2 Ratio
VLDL: 77 mg/dL — ABNORMAL HIGH (ref 0–40)

## 2012-11-15 MED ORDER — PRAVASTATIN SODIUM 40 MG PO TABS: 40.0000 mg | ORAL_TABLET | Freq: Every day | ORAL | Status: DC

## 2012-11-15 NOTE — Progress Notes (Signed)
PT HERE TO ESTABLISH CARE FOLLOWING STROKE 2013. C/O RIGHT ARM PAIN SECONDARY TO STROKE RESIDUAL.VSS

## 2012-11-15 NOTE — Patient Instructions (Signed)
Alcohol Problems Most adults who drink alcohol drink in moderation (not a lot) are at low risk for developing problems related to their drinking. However, all drinkers, including low-risk drinkers, should know about the health risks connected with drinking alcohol. RECOMMENDATIONS FOR LOW-RISK DRINKING  Drink in moderation. Moderate drinking is defined as follows:   Men - no more than 2 drinks per day.  Nonpregnant women - no more than 1 drink per day.  Over age 74 - no more than 1 drink per day. A standard drink is 12 grams of pure alcohol, which is equal to a 12 ounce bottle of beer or wine cooler, a 5 ounce glass of wine, or 1.5 ounces of distilled spirits (such as whiskey, brandy, vodka, or rum).  ABSTAIN FROM (DO NOT DRINK) ALCOHOL:  When pregnant or considering pregnancy.  When taking a medication that interacts with alcohol.  If you are alcohol dependent.  A medical condition that prohibits drinking alcohol (such as ulcer, liver disease, or heart disease). DISCUSS WITH YOUR CAREGIVER:  If you are at risk for coronary heart disease, discuss the potential benefits and risks of alcohol use: Light to moderate drinking is associated with lower rates of coronary heart disease in certain populations (for example, men over age 34 and postmenopausal women). Infrequent or nondrinkers are advised not to begin light to moderate drinking to reduce the risk of coronary heart disease so as to avoid creating an alcohol-related problem. Similar protective effects can likely be gained through proper diet and exercise.  Women and the elderly have smaller amounts of body water than men. As a result women and the elderly achieve a higher blood alcohol concentration after drinking the same amount of alcohol.  Exposing a fetus to alcohol can cause a broad range of birth defects referred to as Fetal Alcohol Syndrome (FAS) or Alcohol-Related Birth Defects (ARBD). Although FAS/ARBD is connected with excessive  alcohol consumption during pregnancy, studies also have reported neurobehavioral problems in infants born to mothers reporting drinking an average of 1 drink per day during pregnancy.  Heavier drinking (the consumption of more than 4 drinks per occasion by men and more than 3 drinks per occasion by women) impairs learning (cognitive) and psychomotor functions and increases the risk of alcohol-related problems, including accidents and injuries. CAGE QUESTIONS:   Have you ever felt that you should Cut down on your drinking?  Have people Annoyed you by criticizing your drinking?  Have you ever felt bad or Guilty about your drinking?  Have you ever had a drink first thing in the morning to steady your nerves or get rid of a hangover (Eye opener)? If you answered positively to any of these questions: You may be at risk for alcohol-related problems if alcohol consumption is:   Men: Greater than 14 drinks per week or more than 4 drinks per occasion.  Women: Greater than 7 drinks per week or more than 3 drinks per occasion. Do you or your family have a medical history of alcohol-related problems, such as:  Blackouts.  Sexual dysfunction.  Depression.  Trauma.  Liver dysfunction.  Sleep disorders.  Hypertension.  Chronic abdominal pain.  Has your drinking ever caused you problems, such as problems with your family, problems with your work (or school) performance, or accidents/injuries?  Do you have a compulsion to drink or a preoccupation with drinking?  Do you have poor control or are you unable to stop drinking once you have started?  Do you have to drink to  avoid withdrawal symptoms?  Do you have problems with withdrawal such as tremors, nausea, sweats, or mood disturbances?  Does it take more alcohol than in the past to get you high?  Do you feel a strong urge to drink?  Do you change your plans so that you can have a drink?  Do you ever drink in the morning to relieve  the shakes or a hangover? If you have answered a number of the previous questions positively, it may be time for you to talk to your caregivers, family, and friends and see if they think you have a problem. Alcoholism is a chemical dependency that keeps getting worse and will eventually destroy your health and relationships. Many alcoholics end up dead, impoverished, or in prison. This is often the end result of all chemical dependency.  Do not be discouraged if you are not ready to take action immediately.  Decisions to change behavior often involve up and down desires to change and feeling like you cannot decide.  Try to think more seriously about your drinking behavior.  Think of the reasons to quit. WHERE TO GO FOR ADDITIONAL INFORMATION   The National Institute on Alcohol Abuse and Alcoholism (NIAAA) BasicStudents.dk  [employer] on Alcoholism and Drug Dependence (NCADD) www.ncadd.org  American Society of Addiction Medicine (ASAM) RoyalDiary.gl  Document Released: 05/08/2005 Document Revised: 07/31/2011 Document Reviewed: 12/25/2007 [facility] Patient Information 2014 [place], [place]. Fat and Cholesterol Control Diet Cholesterol levels in your body are determined significantly by your diet. Cholesterol levels may also be related to heart disease. The following material helps to explain this relationship and discusses what you can do to help keep your heart healthy. Not all cholesterol is bad. Low-density lipoprotein (LDL) cholesterol is the "bad" cholesterol. It may cause fatty deposits to build up inside your arteries. High-density lipoprotein (HDL) cholesterol is "good." It helps to remove the "bad" LDL cholesterol from your blood. Cholesterol is a very important risk factor for heart disease. Other risk factors are high blood pressure, smoking, stress, heredity, and weight. The heart muscle gets its supply of blood through the coronary arteries. If your LDL cholesterol is high and  your HDL cholesterol is low, you are at risk for having fatty deposits build up in your coronary arteries. This leaves less room through which blood can flow. Without sufficient blood and oxygen, the heart muscle cannot function properly and you may feel chest pains (angina pectoris). When a coronary artery closes up entirely, a part of the heart muscle may die causing a heart attack (myocardial infarction). CHECKING CHOLESTEROL When your caregiver sends your blood to a lab to be examined for cholesterol, a complete lipid (fat) profile may be done. With this test, the total amount of cholesterol and levels of LDL and HDL are determined. Triglycerides are a type of fat that circulates in the blood. They can also be used to determine heart disease risk. The list below describes what the numbers should be: Test: Total Cholesterol.  Less than 200 mg/dl. Test: LDL "bad cholesterol."  Less than 100 mg/dl.  Less than 70 mg/dl if you are at very high risk of a heart attack or sudden cardiac death. Test: HDL "good cholesterol."  Greater than 50 mg/dl for women.  Greater than 40 mg/dl for men. Test: Triglycerides.  Less than 150 mg/dl. CONTROLLING CHOLESTEROL WITH DIET Although exercise and lifestyle factors are important, your diet is key. That is because certain foods are known to raise cholesterol and others to lower it.  The goal is to balance foods for their effect on cholesterol and more importantly, to replace saturated and trans fat with other types of fat, such as monounsaturated fat, polyunsaturated fat, and omega-3 fatty acids. On average, a person should consume no more than 15 to 17 g of saturated fat daily. Saturated and trans fats are considered "bad" fats, and they will raise LDL cholesterol. Saturated fats are primarily found in animal products such as meats, butter, and cream. However, that does not mean you need to give up all your favorite foods. Today, there are good tasting, low-fat,  low-cholesterol substitutes for most of the things you like to eat. Choose low-fat or nonfat alternatives. Choose round or loin cuts of red meat. These types of cuts are lowest in fat and cholesterol. Chicken (without the skin), fish, veal, and ground [place] breast are great choices. Eliminate fatty meats, such as hot dogs and salami. Even shellfish have little or no saturated fat. Have a 3 oz (85 g) portion when you eat lean meat, poultry, or fish. Trans fats are also called "partially hydrogenated oils." They are oils that have been scientifically manipulated so that they are solid at room temperature resulting in a longer shelf life and improved taste and texture of foods in which they are added. Trans fats are found in stick margarine, some tub margarines, cookies, crackers, and baked goods.  When baking and cooking, oils are a great substitute for butter. The monounsaturated oils are especially beneficial since it is believed they lower LDL and raise HDL. The oils you should avoid entirely are saturated tropical oils, such as coconut and palm.  Remember to eat a lot from food groups that are naturally free of saturated and trans fat, including fish, fruit, vegetables, beans, grains (barley, rice, couscous, bulgur wheat), and pasta (without cream sauces).  IDENTIFYING FOODS THAT LOWER CHOLESTEROL  Soluble fiber may lower your cholesterol. This type of fiber is found in fruits such as apples, vegetables such as broccoli, potatoes, and carrots, legumes such as beans, peas, and lentils, and grains such as barley. Foods fortified with plant sterols (phytosterol) may also lower cholesterol. You should eat at least 2 g per day of these foods for a cholesterol lowering effect.  Read package labels to identify low-saturated fats, trans fat free, and low-fat foods at the supermarket. Select cheeses that have only 2 to 3 g saturated fat per ounce. Use a heart-healthy tub margarine that is free of trans fats or  partially hydrogenated oil. When buying baked goods (cookies, crackers), avoid partially hydrogenated oils. Breads and muffins should be made from whole grains (whole-wheat or whole oat flour, instead of "flour" or "enriched flour"). Buy non-creamy canned soups with reduced salt and no added fats.  FOOD PREPARATION TECHNIQUES  Never deep-fry. If you must fry, either stir-fry, which uses very little fat, or use non-stick cooking sprays. When possible, broil, bake, or roast meats, and steam vegetables. Instead of putting butter or margarine on vegetables, use lemon and herbs, applesauce, and cinnamon (for squash and sweet potatoes), nonfat yogurt, salsa, and low-fat dressings for salads.  LOW-SATURATED FAT / LOW-FAT FOOD SUBSTITUTES Meats / Saturated Fat (g)  Avoid: Steak, marbled (3 oz/85 g) / 11 g  Choose: Steak, lean (3 oz/85 g) / 4 g  Avoid: Hamburger (3 oz/85 g) / 7 g  Choose: Hamburger, lean (3 oz/85 g) / 5 g  Avoid: Ham (3 oz/85 g) / 6 g  Choose: Ham, lean cut (3 oz/85 g) /  2.4 g  Avoid: Chicken, with skin, dark meat (3 oz/85 g) / 4 g  Choose: Chicken, skin removed, dark meat (3 oz/85 g) / 2 g  Avoid: Chicken, with skin, light meat (3 oz/85 g) / 2.5 g  Choose: Chicken, skin removed, light meat (3 oz/85 g) / 1 g Dairy / Saturated Fat (g)  Avoid: Whole milk (1 cup) / 5 g  Choose: Low-fat milk, 2% (1 cup) / 3 g  Choose: Low-fat milk, 1% (1 cup) / 1.5 g  Choose: Skim milk (1 cup) / 0.3 g  Avoid: Hard cheese (1 oz/28 g) / 6 g  Choose: Skim milk cheese (1 oz/28 g) / 2 to 3 g  Avoid: Cottage cheese, 4% fat (1 cup) / 6.5 g  Choose: Low-fat cottage cheese, 1% fat (1 cup) / 1.5 g  Avoid: Ice cream (1 cup) / 9 g  Choose: Sherbet (1 cup) / 2.5 g  Choose: Nonfat frozen yogurt (1 cup) / 0.3 g  Choose: Frozen fruit bar / trace  Avoid: Whipped cream (1 tbs) / 3.5 g  Choose: Nondairy whipped topping (1 tbs) / 1 g Condiments / Saturated Fat (g)  Avoid: Mayonnaise (1 tbs) /  2 g  Choose: Low-fat mayonnaise (1 tbs) / 1 g  Avoid: Butter (1 tbs) / 7 g  Choose: Extra light margarine (1 tbs) / 1 g  Avoid: Coconut oil (1 tbs) / 11.8 g  Choose: Olive oil (1 tbs) / 1.8 g  Choose: Corn oil (1 tbs) / 1.7 g  Choose: Safflower oil (1 tbs) / 1.2 g  Choose: Sunflower oil (1 tbs) / 1.4 g  Choose: Soybean oil (1 tbs) / 2.4 g  Choose: Canola oil (1 tbs) / 1 g Document Released: 05/08/2005 Document Revised: 07/31/2011 Document Reviewed: 10/27/2010 [employer] Patient Information 2014 [place], [place].

## 2012-11-15 NOTE — Progress Notes (Signed)
PT REFUSED TO SCHEDULE APPT TODAY. STATES HE WILL CALL IN 4 MNTHS.
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
## 2012-11-20 ENCOUNTER — Telehealth: Payer: Self-pay | Admitting: Internal Medicine

## 2012-11-20 NOTE — Telephone Encounter (Signed)
Ernest Thomas was notified of his abnormal FLP results.  He indicates that he had not been taking his Pravachol prior to his recent visit.  Instructed to take the Pravachol daily, as directed, and to return to the clinic in 6 weeks for a repeat FLP.  RAMA,CHRISTINA 11/20/2012 2:27 PM

## 2012-11-20 NOTE — Progress Notes (Signed)
Quick Note:  Schedule follow up appt for re-check FLP in 6 weeks. Patient aware of results. He had not been taking his Pravachol.  Ernest Thomas 11/20/2012 2:47 PM  ______

## 2014-11-01 ENCOUNTER — Encounter (HOSPITAL_COMMUNITY): Payer: Self-pay | Admitting: *Deleted

## 2014-11-01 ENCOUNTER — Emergency Department (HOSPITAL_COMMUNITY)
Admission: EM | Admit: 2014-11-01 | Discharge: 2014-11-02 | Disposition: A | Discharge status: Home / Self Care | Payer: Medicaid Other | Attending: Emergency Medicine | Admitting: Emergency Medicine

## 2014-11-01 ENCOUNTER — Emergency Department (HOSPITAL_COMMUNITY): Payer: Medicaid Other

## 2014-11-01 DIAGNOSIS — Y998 Other external cause status: Secondary | ICD-10-CM | POA: Diagnosis not present

## 2014-11-01 DIAGNOSIS — Z7982 Long term (current) use of aspirin: Secondary | ICD-10-CM | POA: Diagnosis not present

## 2014-11-01 DIAGNOSIS — Y92410 Unspecified street and highway as the place of occurrence of the external cause: Secondary | ICD-10-CM | POA: Insufficient documentation

## 2014-11-01 DIAGNOSIS — Z720 Tobacco use: Secondary | ICD-10-CM | POA: Insufficient documentation

## 2014-11-01 DIAGNOSIS — Z041 Encounter for examination and observation following transport accident: Secondary | ICD-10-CM | POA: Diagnosis present

## 2014-11-01 DIAGNOSIS — Z8673 Personal history of transient ischemic attack (TIA), and cerebral infarction without residual deficits: Secondary | ICD-10-CM | POA: Insufficient documentation

## 2014-11-01 DIAGNOSIS — F10129 Alcohol abuse with intoxication, unspecified: Secondary | ICD-10-CM | POA: Diagnosis not present

## 2014-11-01 DIAGNOSIS — F1010 Alcohol abuse, uncomplicated: Secondary | ICD-10-CM

## 2014-11-01 DIAGNOSIS — R55 Syncope and collapse: Secondary | ICD-10-CM | POA: Insufficient documentation

## 2014-11-01 DIAGNOSIS — E785 Hyperlipidemia, unspecified: Secondary | ICD-10-CM | POA: Insufficient documentation

## 2014-11-01 DIAGNOSIS — Y9389 Activity, other specified: Secondary | ICD-10-CM | POA: Insufficient documentation

## 2014-11-01 LAB — COMPREHENSIVE METABOLIC PANEL
ALBUMIN: 3.3 g/dL — AB (ref 3.5–5.0)
ALT: 31 U/L (ref 17–63)
ANION GAP: 12 (ref 5–15)
AST: 65 U/L — ABNORMAL HIGH (ref 15–41)
Alkaline Phosphatase: 62 U/L (ref 38–126)
BUN: 10 mg/dL (ref 6–20)
CALCIUM: 8.6 mg/dL — AB (ref 8.9–10.3)
CHLORIDE: 103 mmol/L (ref 101–111)
CO2: 21 mmol/L — ABNORMAL LOW (ref 22–32)
Creatinine, Ser: 1.09 mg/dL (ref 0.61–1.24)
GFR calc Af Amer: >60 mL/min (ref >60)
GFR calc non Af Amer: >60 mL/min (ref >60)
GLUCOSE: 106 mg/dL — AB (ref 65–99)
Potassium: 3.9 mmol/L (ref 3.5–5.1)
Sodium: 136 mmol/L (ref 135–145)
Total Bilirubin: 0.9 mg/dL (ref 0.3–1.2)
Total Protein: 5.7 g/dL — ABNORMAL LOW (ref 6.5–8.1)

## 2014-11-01 LAB — I-STAT CG4 LACTIC ACID, ED: Lactic Acid, Venous: 2.47 mmol/L (ref 0.5–2.0)

## 2014-11-01 LAB — CBC WITH DIFFERENTIAL/PLATELET
Basophils Absolute: 0 10*3/uL (ref 0.0–0.1)
Basophils Relative: 0 % (ref 0–1)
Eosinophils Absolute: 0 10*3/uL (ref 0.0–0.7)
Eosinophils Relative: 0 % (ref 0–5)
HCT: 43.2 % (ref 39.0–52.0)
HEMOGLOBIN: 14.8 g/dL (ref 13.0–17.0)
LYMPHS ABS: 1.9 10*3/uL (ref 0.7–4.0)
LYMPHS PCT: 21 % (ref 12–46)
MCH: 36 pg — ABNORMAL HIGH (ref 26.0–34.0)
MCHC: 34.3 g/dL (ref 30.0–36.0)
MCV: 105.1 fL — ABNORMAL HIGH (ref 78.0–100.0)
MONOS PCT: 7 % (ref 3–12)
Monocytes Absolute: 0.6 10*3/uL (ref 0.1–1.0)
NEUTROS ABS: 6.6 10*3/uL (ref 1.7–7.7)
Neutrophils Relative %: 72 % (ref 43–77)
PLATELETS: 214 10*3/uL (ref 150–400)
RBC: 4.11 MIL/uL — AB (ref 4.22–5.81)
RDW: 13.6 % (ref 11.5–15.5)
WBC: 9.2 10*3/uL (ref 4.0–10.5)

## 2014-11-01 LAB — ETHANOL: Alcohol, Ethyl (B): 90 mg/dL — ABNORMAL HIGH (ref <5)

## 2014-11-01 LAB — PROTIME-INR
INR: 1.07 (ref 0.00–1.49)
PROTHROMBIN TIME: 14.1 s (ref 11.6–15.2)

## 2014-11-01 LAB — I-STAT TROPONIN, ED: TROPONIN I, POC: 0 ng/mL (ref 0.00–0.08)

## 2014-11-01 NOTE — ED Notes (Signed)
Oxygen sats 86% on room air; placed on 2L [place], sats increased to 95% on 2L
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
## 2014-11-01 NOTE — ED Notes (Signed)
Dr Mora Bellman given a copy of lactic acid results 2.47

## 2014-11-02 ENCOUNTER — Encounter (HOSPITAL_COMMUNITY): Payer: Self-pay | Admitting: Radiology

## 2014-11-02 ENCOUNTER — Emergency Department (HOSPITAL_COMMUNITY): Payer: Medicaid Other

## 2014-11-02 LAB — URINALYSIS, ROUTINE W REFLEX MICROSCOPIC
BILIRUBIN URINE: NEGATIVE
Glucose, UA: NEGATIVE mg/dL
Hgb urine dipstick: NEGATIVE
KETONES UR: NEGATIVE mg/dL
Leukocytes, UA: NEGATIVE
Nitrite: NEGATIVE
PH: 5 (ref 5.0–8.0)
PROTEIN: NEGATIVE mg/dL
Specific Gravity, Urine: 1.006 (ref 1.005–1.030)
Urobilinogen, UA: 0.2 mg/dL (ref 0.0–1.0)

## 2014-11-02 LAB — RAPID URINE DRUG SCREEN, HOSP PERFORMED
AMPHETAMINES: NOT DETECTED
BENZODIAZEPINES: NOT DETECTED
Barbiturates: NOT DETECTED
Cocaine: NOT DETECTED
OPIATES: NOT DETECTED
Tetrahydrocannabinol: POSITIVE — AB

## 2014-11-02 MED ORDER — SODIUM CHLORIDE 0.9 % IV BOLUS (SEPSIS)
1000.0000 mL | Freq: Once | INTRAVENOUS | Status: AC
  Administered 2014-11-02: 1000 mL via INTRAVENOUS

## 2014-11-02 NOTE — ED Notes (Signed)
Dr. Mora Bellman at bedside; c-collar removed
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
## 2014-11-02 NOTE — ED Notes (Addendum)
Pt ambulated in hallway; gait noted to be steadier than previous ambulation. Pt c/o R sided ribcage pain on movement; also reports dizziness. Pt requesting to speak to EDP prior to discharge
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
## 2016-05-09 ENCOUNTER — Ambulatory Visit: Payer: Medicaid Other | Attending: Family Medicine | Admitting: Family Medicine

## 2016-05-09 ENCOUNTER — Encounter: Payer: Self-pay | Admitting: Family Medicine

## 2016-05-09 VITALS — BP 115/83 | HR 71 | Temp 97.6°F | Ht 68.0 in | Wt 149.4 lb

## 2016-05-09 DIAGNOSIS — E785 Hyperlipidemia, unspecified: Secondary | ICD-10-CM | POA: Insufficient documentation

## 2016-05-09 DIAGNOSIS — Z7982 Long term (current) use of aspirin: Secondary | ICD-10-CM | POA: Insufficient documentation

## 2016-05-09 DIAGNOSIS — Z8673 Personal history of transient ischemic attack (TIA), and cerebral infarction without residual deficits: Secondary | ICD-10-CM | POA: Diagnosis present

## 2016-05-09 DIAGNOSIS — F17210 Nicotine dependence, cigarettes, uncomplicated: Secondary | ICD-10-CM | POA: Insufficient documentation

## 2016-05-09 DIAGNOSIS — F1010 Alcohol abuse, uncomplicated: Secondary | ICD-10-CM | POA: Insufficient documentation

## 2016-05-09 DIAGNOSIS — Z23 Encounter for immunization: Secondary | ICD-10-CM

## 2016-05-09 DIAGNOSIS — Z720 Tobacco use: Secondary | ICD-10-CM

## 2016-05-09 LAB — COMPLETE METABOLIC PANEL WITH GFR
ALT: 14 U/L (ref 9–46)
AST: 19 U/L (ref 10–40)
Albumin: 3.9 g/dL (ref 3.6–5.1)
Alkaline Phosphatase: 66 U/L (ref 40–115)
BUN: 12 mg/dL (ref 7–25)
CALCIUM: 9.2 mg/dL (ref 8.6–10.3)
CHLORIDE: 108 mmol/L (ref 98–110)
CO2: 28 mmol/L (ref 20–31)
CREATININE: 1.1 mg/dL (ref 0.60–1.35)
GFR, Est African American: >89 mL/min (ref >=60)
GFR, Est Non African American: 81 mL/min (ref >=60)
GLUCOSE: 100 mg/dL — AB (ref 65–99)
Potassium: 4.2 mmol/L (ref 3.5–5.3)
Sodium: 145 mmol/L (ref 135–146)
Total Bilirubin: 0.4 mg/dL (ref 0.2–1.2)
Total Protein: 6.4 g/dL (ref 6.1–8.1)

## 2016-05-09 LAB — LIPID PANEL
CHOLESTEROL: 264 mg/dL — AB (ref <200)
HDL: 36 mg/dL — ABNORMAL LOW (ref >40)
LDL Cholesterol: 181 mg/dL — ABNORMAL HIGH (ref <100)
Total CHOL/HDL Ratio: 7.3 Ratio — ABNORMAL HIGH (ref <5.0)
Triglycerides: 237 mg/dL — ABNORMAL HIGH (ref <150)
VLDL: 47 mg/dL — AB (ref <30)

## 2016-05-09 MED ORDER — ASPIRIN EC 81 MG PO TBEC: 81.0000 mg | DELAYED_RELEASE_TABLET | Freq: Every day | ORAL | 11 refills | Status: DC

## 2016-05-09 MED ORDER — THERA VITAL M PO TABS: 1.0000 | ORAL_TABLET | Freq: Every day | ORAL | 11 refills | Status: DC

## 2016-05-09 MED ORDER — ATORVASTATIN CALCIUM 40 MG PO TABS: 40.0000 mg | ORAL_TABLET | Freq: Every day | ORAL | 11 refills | Status: DC

## 2016-05-09 MED ORDER — ADULT MULTIVITAMIN W/MINERALS CH: 1.0000 | ORAL_TABLET | Freq: Every day | ORAL | 12 refills | Status: DC

## 2016-05-09 MED FILL — ATORVASTATIN 40 MG TABLET: 40 | 30 days supply | Qty: 30 | Fill #0

## 2016-05-09 NOTE — Assessment & Plan Note (Signed)
Hx of CVA No neuro deficits No new stroke symptoms  Plan: Restart Aspirin and statin for secondary prevention

## 2016-05-09 NOTE — Progress Notes (Signed)
LOGO@  Subjective:  Patient ID: Ernest HackerJohn D Apuzzo Jr., male    DOB: December 18, 1970  Age: 45 y.o. MRN: [id number]  CC: Hyperlipidemia and Cerebrovascular Accident (2013)   HPI Ernest HackerJohn D Cirillo Jr. presents for    1. F/u CVA: has hx of CVA in 2013. No residual deficits. Not taking statin or aspirin for secondary prevention. He is a smoker. He denies weakness and numbness. He has intermittent pain in his arms and legs.  2. Alcohol abuse: he reports drinking 60 oz of beer 1-2 times per week. He denies GI upset and bleeding. He denies abdominal swelling. He has hx of DUI. He denies alcohol withdrawal seizures and tremors.   3. Hyperlipidemia: not taking statin.   3. Poor dentition: has intermittent dental pain.   Past Medical History:  Diagnosis Date  . Alcohol abuse    4 DUI, lost license many times  . Hyperlipidemia   . Stroke 2013   Acute left dorsolateral thalamus ischemic CVA, no residual sxs.    No past surgical history on file.  Family History  Problem Relation Age of Onset  . Diabetes Mother   . Cancer Mother     ovarian  . Hypertension Mother   . Deep vein thrombosis Mother   . Stroke Mother   . Multiple sclerosis Mother   . Diabetes Maternal Aunt   . Stroke Paternal Aunt   . Cancer Maternal Aunt     lung    Social History  Substance Use Topics  . Smoking status: Current Every Day Smoker    Packs/day: 0.25    Types: Cigarettes  . Smokeless tobacco: Not on file  . Alcohol use 0.6 oz/week    1 Cans of beer per week    ROS Review of Systems  Constitutional: Negative for chills, fatigue, fever and unexpected weight change.  HENT: Positive for dental problem.   Eyes: Negative for visual disturbance.  Respiratory: Negative for cough and shortness of breath.   Cardiovascular: Negative for chest pain, palpitations and leg swelling.  Gastrointestinal: Negative for abdominal pain, blood in stool, constipation, diarrhea, nausea and vomiting.  Endocrine: Negative for  polydipsia, polyphagia and polyuria.  Musculoskeletal: Positive for arthralgias and back pain. Negative for gait problem, myalgias and neck pain.  Skin: Negative for rash.  Allergic/Immunologic: Negative for immunocompromised state.  Hematological: Negative for adenopathy. Does not bruise/bleed easily.  Psychiatric/Behavioral: Negative for dysphoric mood, sleep disturbance and suicidal ideas. The patient is not nervous/anxious.     Objective:   Today's Vitals: BP 115/83 (BP Location: Left Arm, Patient Position: Sitting, Cuff Size: Small)   Pulse 71   Temp 97.6 F (36.4 C) (Oral)   Ht 5\' 8"  (1.727 m)   Wt 149 lb 6.4 oz (67.8 kg)   SpO2 95%   BMI 22.72 kg/m   Physical Exam  Constitutional: He appears well-developed and well-nourished. No distress.  Thin   HENT:  Head: Normocephalic and atraumatic.  Mouth/Throat: Abnormal dentition.  Neck: Normal range of motion. Neck supple.  Cardiovascular: Normal rate, regular rhythm, normal heart sounds and intact distal pulses.   Pulmonary/Chest: Effort normal and breath sounds normal.  Abdominal: Soft. Bowel sounds are normal. He exhibits no distension and no mass. There is no tenderness. There is no rebound and no guarding.  Musculoskeletal: He exhibits no edema.  Neurological: He is alert.  Skin: Skin is warm and dry. No rash noted. No erythema.  Psychiatric: He has a normal mood and affect.    Assessment &  Plan:   Problem List Items Addressed This Visit      High   Tobacco abuse (Chronic)   Hyperlipidemia with target low density lipoprotein (LDL) cholesterol less than 100 mg/dL (Chronic)   Relevant Medications   atorvastatin (LIPITOR) 40 MG tablet   aspirin EC 81 MG tablet   Other Relevant Orders   Lipid Panel   COMPLETE METABOLIC PANEL WITH GFR   History of ischemic stroke without residual deficits - Primary (Chronic)   Relevant Medications   atorvastatin (LIPITOR) 40 MG tablet   aspirin EC 81 MG tablet   Other Relevant  Orders   Lipid Panel   COMPLETE METABOLIC PANEL WITH GFR   Alcohol abuse (Chronic)   Relevant Medications   Multiple Vitamins-Minerals (MULTIVITAMIN) tablet   Other Relevant Orders   CBC      Outpatient Encounter Prescriptions as of 05/09/2016  Medication Sig  . aspirin EC 325 MG tablet Take 1 tablet (325 mg total) by mouth daily. (Patient taking differently: Take 325 mg by mouth daily as needed (headaches). )  . Multiple Vitamin (MULTIVITAMIN WITH MINERALS) TABS Take 1 tablet by mouth daily. (Patient not taking: Reported on 11/01/2014)  . pravastatin (PRAVACHOL) 40 MG tablet Take 1 tablet (40 mg total) by mouth daily. (Patient not taking: Reported on 11/01/2014)   No facility-administered encounter medications on file as of 05/09/2016.     Follow-up: Return in about 3 months (around 08/07/2016) for high cholesterol .    Dessa PhiJosalyn Chenel Wernli MD

## 2016-05-09 NOTE — Patient Instructions (Addendum)
Ernest Thomas was seen today for hyperlipidemia and cerebrovascular accident.  Diagnoses and all orders for this visit:  History of ischemic stroke without residual deficits -     Lipid Panel -     COMPLETE METABOLIC PANEL WITH GFR -     atorvastatin (LIPITOR) 40 MG tablet; Take 1 tablet (40 mg total) by mouth daily. -     aspirin EC 81 MG tablet; Take 1 tablet (81 mg total) by mouth daily.  Hyperlipidemia with target low density lipoprotein (LDL) cholesterol less than 100 mg/dL -     Lipid Panel -     COMPLETE METABOLIC PANEL WITH GFR -     atorvastatin (LIPITOR) 40 MG tablet; Take 1 tablet (40 mg total) by mouth daily.  Tobacco abuse  Alcohol abuse -     Discontinue: Multiple Vitamin (MULTIVITAMIN WITH MINERALS) TABS tablet; Take 1 tablet by mouth daily. -     Multiple Vitamins-Minerals (MULTIVITAMIN) tablet; Take 1 tablet by mouth daily. -     CBC   Smoking cessation support: smoking cessation hotline: 1-800-QUIT-NOW.  Smoking cessation classes are available through [facility]. Call [phone] or visit our website at [email or web address].   F/u in 3 months for hyperlipidemia   Dr. Armen PickupFunches

## 2016-05-09 NOTE — Assessment & Plan Note (Signed)
Ongoing alcohol abuse Patient counseled to reduce alcohol intake Counseled that the risk of ongoing heavy alcohol use include hepatitis, cirrhosis, gastritis

## 2016-05-09 NOTE — Assessment & Plan Note (Signed)
-   Cessation counseling provided. 

## 2016-05-10 LAB — CBC
HCT: 47.8 % (ref 38.5–50.0)
Hemoglobin: 16 g/dL (ref 13.2–17.1)
MCH: 36 pg — ABNORMAL HIGH (ref 27.0–33.0)
MCHC: 33.5 g/dL (ref 32.0–36.0)
MCV: 107.4 fL — ABNORMAL HIGH (ref 80.0–100.0)
MPV: 10.7 fL (ref 7.5–12.5)
PLATELETS: 233 10*3/uL (ref 140–400)
RBC: 4.45 MIL/uL (ref 4.20–5.80)
RDW: 16 % — AB (ref 11.0–15.0)
WBC: 8 10*3/uL (ref 3.8–10.8)

## 2016-05-17 ENCOUNTER — Telehealth: Payer: Self-pay

## 2016-05-17 NOTE — Telephone Encounter (Signed)
Pt was called and informed of lab results and medication being sent to onsite pharmacy. 

## 2016-10-04 ENCOUNTER — Encounter: Payer: Self-pay | Admitting: Family Medicine
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
## 2016-10-10 ENCOUNTER — Ambulatory Visit: Payer: Medicaid Other | Attending: Family Medicine | Admitting: Family Medicine

## 2016-10-10 ENCOUNTER — Encounter: Payer: Self-pay | Admitting: Family Medicine

## 2016-10-10 VITALS — BP 140/82 | HR 86 | Temp 98.2°F | Wt 148.4 lb

## 2016-10-10 DIAGNOSIS — N529 Male erectile dysfunction, unspecified: Secondary | ICD-10-CM | POA: Insufficient documentation

## 2016-10-10 DIAGNOSIS — R030 Elevated blood-pressure reading, without diagnosis of hypertension: Secondary | ICD-10-CM | POA: Insufficient documentation

## 2016-10-10 DIAGNOSIS — F17210 Nicotine dependence, cigarettes, uncomplicated: Secondary | ICD-10-CM | POA: Insufficient documentation

## 2016-10-10 DIAGNOSIS — Z8673 Personal history of transient ischemic attack (TIA), and cerebral infarction without residual deficits: Secondary | ICD-10-CM | POA: Insufficient documentation

## 2016-10-10 DIAGNOSIS — S30861A Insect bite (nonvenomous) of abdominal wall, initial encounter: Secondary | ICD-10-CM | POA: Diagnosis not present

## 2016-10-10 DIAGNOSIS — Z7982 Long term (current) use of aspirin: Secondary | ICD-10-CM | POA: Diagnosis not present

## 2016-10-10 DIAGNOSIS — Z79899 Other long term (current) drug therapy: Secondary | ICD-10-CM | POA: Insufficient documentation

## 2016-10-10 DIAGNOSIS — W57XXXA Bitten or stung by nonvenomous insect and other nonvenomous arthropods, initial encounter: Secondary | ICD-10-CM

## 2016-10-10 DIAGNOSIS — E785 Hyperlipidemia, unspecified: Secondary | ICD-10-CM | POA: Diagnosis not present

## 2016-10-10 HISTORY — DX: Insect bite (nonvenomous) of abdominal wall, initial encounter: S30.861A

## 2016-10-10 HISTORY — DX: Bitten or stung by nonvenomous insect and other nonvenomous arthropods, initial encounter: W57.XXXA

## 2016-10-10 MED ORDER — SILDENAFIL CITRATE 20 MG PO TABS: 60.0000 mg | ORAL_TABLET | Freq: Every day | ORAL | 0 refills | Status: DC | PRN

## 2016-10-10 MED ORDER — ASPIRIN EC 81 MG PO TBEC: 81.0000 mg | DELAYED_RELEASE_TABLET | Freq: Every day | ORAL | 11 refills | Status: DC

## 2016-10-10 MED ORDER — LORATADINE 10 MG PO TABS: 10.0000 mg | ORAL_TABLET | Freq: Every day | ORAL | 0 refills | Status: DC

## 2016-10-10 MED ORDER — ATORVASTATIN CALCIUM 40 MG PO TABS: 40.0000 mg | ORAL_TABLET | Freq: Every day | ORAL | 11 refills | Status: DC

## 2016-10-10 MED FILL — SILDENAFIL 20 MG TABLET: 20 | 10 days supply | Qty: 30 | Fill #0

## 2016-10-10 MED FILL — LORATADINE 10 MG TABLET: 10 | 30 days supply | Qty: 30 | Fill #0

## 2016-10-10 MED FILL — ATORVASTATIN 40 MG TABLET: 40 | 30 days supply | Qty: 30 | Fill #0

## 2016-10-10 NOTE — Progress Notes (Signed)
Subjective:  Patient ID: Ernest Thomas., male    DOB: [birth year]  Age: 46 y.o. MRN: [id number]  CC: Tick Removal   HPI [name]. presents for   1. Erectile dysfunction: x one year. He is able to get the erection but it last for about a minute or less. He denies chest pain. He has no known CAD. He smokes 1/2 PPD. He drinks alcohol daily. He has hx of CVA.   2. Alcohol intake: he reports drinking 3, 12 oz beers per day.   3. Tick bite: to R flank pain. Occurred 3 days ago. He removed the tick. The tick was not engorged. He report redness, mild swelling and pruritus. He denies fever, chills, nights.   Social History  Substance Use Topics  . Smoking status: Current Every Day Smoker    Packs/day: 0.25    Types: Cigarettes  . Smokeless tobacco: Never Used  . Alcohol use 0.6 oz/week    1 Cans of beer per week   Outpatient Medications Prior to Visit  Medication Sig Dispense Refill  . aspirin EC 81 MG tablet Take 1 tablet (81 mg total) by mouth daily. 30 tablet 11  . atorvastatin (LIPITOR) 40 MG tablet Take 1 tablet (40 mg total) by mouth daily. 30 tablet 11  . Multiple Vitamins-Minerals (MULTIVITAMIN) tablet Take 1 tablet by mouth daily. 30 tablet 11   No facility-administered medications prior to visit.     ROS Review of Systems  Constitutional: Negative for chills, fatigue, fever and unexpected weight change.  Eyes: Negative for visual disturbance.  Respiratory: Negative for cough and shortness of breath.   Cardiovascular: Negative for chest pain, palpitations and leg swelling.  Gastrointestinal: Negative for abdominal pain, blood in stool, constipation, diarrhea, nausea and vomiting.  Endocrine: Negative for polydipsia, polyphagia and polyuria.  Musculoskeletal: Negative for arthralgias, back pain, gait problem, myalgias and neck pain.  Skin: Positive for color change. Negative for rash.  Allergic/Immunologic: Negative for immunocompromised state.    Hematological: Negative for adenopathy. Does not bruise/bleed easily.  Psychiatric/Behavioral: Negative for dysphoric mood, sleep disturbance and suicidal ideas. The patient is not nervous/anxious.     Objective:  BP (!) 139/95   Pulse 86   Temp 98.2 F (36.8 C) (Oral)   Wt 148 lb 6.4 oz (67.3 kg)   SpO2 99%   BMI 22.56 kg/m   BP/Weight 10/10/2016 05/09/2016 11/02/2014  Systolic BP 139 115 110  Diastolic BP 95 83 84  Wt. (Lbs) 148.4 149.4 -  BMI 22.56 22.72 -    Physical Exam  Constitutional: He appears well-developed and well-nourished. No distress.  HENT:  Head: Normocephalic and atraumatic.  Neck: Normal range of motion. Neck supple.  Cardiovascular: Normal rate, regular rhythm, normal heart sounds and intact distal pulses.   Pulses:      Radial pulses are 2+ on the right side.       Femoral pulses are 2+ on the left side.      Dorsalis pedis pulses are 2+ on the right side, and 2+ on the left side.  Pulmonary/Chest: Effort normal and breath sounds normal.  Abdominal: A hernia is present. Hernia confirmed positive in the left inguinal area. Hernia confirmed negative in the right inguinal area.  Genitourinary: Penis normal.     Musculoskeletal: He exhibits no edema.  Lymphadenopathy:       Left: No inguinal adenopathy present.  Neurological: He is alert.  Skin: Skin is warm and dry. No rash  noted. No erythema.     Psychiatric: He has a normal mood and affect.   Assessment & Plan:  Ernest Thomas was seen today for tick removal.  Diagnoses and all orders for this visit:  Erectile dysfunction, unspecified erectile dysfunction type -     sildenafil (REVATIO) 20 MG tablet; Take 3 tablets (60 mg total) by mouth daily as needed (erectile dysfunction. take 30 minutes before sexual intercourse).  History of ischemic stroke without residual deficits -     aspirin EC 81 MG tablet; Take 1 tablet (81 mg total) by mouth daily. -     atorvastatin (LIPITOR) 40 MG tablet; Take 1  tablet (40 mg total) by mouth daily.  Hyperlipidemia with target low density lipoprotein (LDL) cholesterol less than 100 mg/dL -     atorvastatin (LIPITOR) 40 MG tablet; Take 1 tablet (40 mg total) by mouth daily.  Tick bite of abdomen, initial encounter -     loratadine (CLARITIN) 10 MG tablet; Take 1 tablet (10 mg total) by mouth daily.  Elevated BP without diagnosis of hypertension   There are no diagnoses linked to this encounter.  No orders of the defined types were placed in this encounter.   Follow-up: Return in about 4 weeks (around 11/07/2016) for BP check .   [name] MD

## 2016-10-10 NOTE — Assessment & Plan Note (Signed)
Close f/u for recheck If elevated at recheck Start prinzide 10-12.5 mg daily

## 2016-10-10 NOTE — Assessment & Plan Note (Signed)
A; Erectile dysfunction in smoker with excessive ETOH P: Smoking cessation ETOH reduction to 12 oz per less per day Sildenafil 60 mg  30 minutes before intercourse

## 2016-10-10 NOTE — Assessment & Plan Note (Signed)
Refilled lipitor 

## 2016-10-10 NOTE — Patient Instructions (Addendum)
Desten was seen today for tick removal.  Diagnoses and all orders for this visit:  Erectile dysfunction, unspecified erectile dysfunction type -     sildenafil (REVATIO) 20 MG tablet; Take 3 tablets (60 mg total) by mouth daily as needed (erectile dysfunction. take 30 minutes before sexual intercourse).  History of ischemic stroke without residual deficits -     aspirin EC 81 MG tablet; Take 1 tablet (81 mg total) by mouth daily. -     atorvastatin (LIPITOR) 40 MG tablet; Take 1 tablet (40 mg total) by mouth daily.  Hyperlipidemia with target low density lipoprotein (LDL) cholesterol less than 100 mg/dL -     atorvastatin (LIPITOR) 40 MG tablet; Take 1 tablet (40 mg total) by mouth daily.  Tick bite of abdomen, initial encounter -     loratadine (CLARITIN) 10 MG tablet; Take 1 tablet (10 mg total) by mouth daily.   Apply cold pack or calamine lotion to bite area I have sent in sildenafil this the generic viagra to the pharmacy  Reduce alcohol to 1,  12 oz per day  Work to quit smoking Smoking cessation support: smoking cessation hotline: 1-800-QUIT-NOW.  Smoking cessation classes are available through [facility]. Call [phone] or visit our website at [email or web address].  F/u in 4 weeks for RN BP check we will plan to treat if BP elevated at next visit with prinzide   Dr. Armen PickupFunches

## 2016-10-10 NOTE — Assessment & Plan Note (Signed)
Continue lipitor and daily aspirin for secondary prevention Advised smoking cessation

## 2016-10-10 NOTE — Assessment & Plan Note (Signed)
Tick bite with local skin reaction Patient does not meet criteria for doxycyline prophylaxis for tick borne illness Plan: Claritin, cold pack and calamine lotion for local skin reaction

## 2016-11-20 DIAGNOSIS — R21 Rash and other nonspecific skin eruption: Secondary | ICD-10-CM | POA: Diagnosis not present

## 2016-11-20 DIAGNOSIS — B029 Zoster without complications: Secondary | ICD-10-CM | POA: Diagnosis not present

## 2016-11-28 ENCOUNTER — Ambulatory Visit: Payer: Medicaid Other | Attending: Family Medicine | Admitting: Pharmacist

## 2016-11-28 ENCOUNTER — Encounter: Payer: Self-pay | Admitting: Pharmacist

## 2016-11-28 ENCOUNTER — Ambulatory Visit (HOSPITAL_BASED_OUTPATIENT_CLINIC_OR_DEPARTMENT_OTHER): Payer: Medicaid Other | Admitting: Physician Assistant

## 2016-11-28 ENCOUNTER — Encounter: Payer: Self-pay | Admitting: Physician Assistant

## 2016-11-28 VITALS — BP 140/110

## 2016-11-28 DIAGNOSIS — F1010 Alcohol abuse, uncomplicated: Secondary | ICD-10-CM | POA: Insufficient documentation

## 2016-11-28 DIAGNOSIS — Z8673 Personal history of transient ischemic attack (TIA), and cerebral infarction without residual deficits: Secondary | ICD-10-CM | POA: Insufficient documentation

## 2016-11-28 DIAGNOSIS — I10 Essential (primary) hypertension: Secondary | ICD-10-CM | POA: Diagnosis not present

## 2016-11-28 DIAGNOSIS — R030 Elevated blood-pressure reading, without diagnosis of hypertension: Secondary | ICD-10-CM

## 2016-11-28 DIAGNOSIS — N529 Male erectile dysfunction, unspecified: Secondary | ICD-10-CM

## 2016-11-28 DIAGNOSIS — Z760 Encounter for issue of repeat prescription: Secondary | ICD-10-CM | POA: Insufficient documentation

## 2016-11-28 MED ORDER — SILDENAFIL CITRATE 20 MG PO TABS: 60.0000 mg | ORAL_TABLET | Freq: Every day | ORAL | 0 refills | Status: DC | PRN

## 2016-11-28 NOTE — Progress Notes (Signed)
Chief Complaint: Medication refill  Subjective: 46 yo male that is followed here for HTN, ED, hx CVA without residual deficits and alcohol abuse who presents to the Pharmacist for a BP check.   He expressed that he was recently diagnosed with [place]ing[place] and [place]. He asked to see a PCP but did not clearly specify for what reason. Upon my questioning, it seems as though he just needs a medication refill. He states that he lost his meds, specifically the Revatio.   No new problems to report.    Objective:  Vitals:   11/28/16 1033  BP: (!) 129/93  Pulse: 84  Resp: 16  Temp: 98 F (36.7 C)  TempSrc: Oral  SpO2: 95%    Physical Exam:no P/E performed    Medications: Prior to Admission medications   Medication Sig Start Date End Date Taking? Authorizing Provider  diphenhydrAMINE (BENADRYL) 25 MG tablet Take 25 mg by mouth every 6 (six) hours as needed.   Yes [provider]  mupirocin ointment (BACTROBAN) 2 % Apply 1 application topically 3 (three) times daily.   Yes [provider]  aspirin EC 81 MG tablet Take 1 tablet (81 mg total) by mouth daily. Patient not taking: Reported on 11/28/2016 10/10/16   Dessa PhiFunches, Josalyn, MD  atorvastatin (LIPITOR) 40 MG tablet Take 1 tablet (40 mg total) by mouth daily. Patient not taking: Reported on 11/28/2016 10/10/16   Dessa PhiFunches, Josalyn, MD  loratadine (CLARITIN) 10 MG tablet Take 1 tablet (10 mg total) by mouth daily. Patient not taking: Reported on 11/28/2016 10/10/16   Dessa PhiFunches, Josalyn, MD  Multiple Vitamins-Minerals (MULTIVITAMIN) tablet Take 1 tablet by mouth daily. Patient not taking: Reported on 11/28/2016 05/09/16   Dessa PhiFunches, Josalyn, MD  sildenafil (REVATIO) 20 MG tablet Take 3 tablets (60 mg total) by mouth daily as needed (erectile dysfunction. take 30 minutes before sexual intercourse). 11/28/16   Vivianne MasterNoel, Dodi Leu S, PA-C  valACYclovir (VALTREX) 1000 MG tablet Take 1,000 mg by mouth 3 (three) times daily.     [provider]    Assessment: Medication refill  Plan: Refills provided  Follow up:as scheduled with Dr. Armen PickupFunches  The patient was given clear instructions to go to ER or return to medical center if symptoms don't improve, worsen or new problems develop. The patient verbalized understanding. The patient was told to call to get lab results if they haven't heard anything in the next week.   This note has been created with Education officer, environmentalDragon speech recognition software and smart phrase technology. Any transcriptional errors are unintentional.   Scot Juniffany Mia Winthrop, PA-C 11/28/2016, 1:27 PM

## 2016-11-28 NOTE — Patient Instructions (Signed)
I have refilled your medicine for erectile dysfunction The remaining medications have refills available at your pharmacy; please pick them up today

## 2016-11-28 NOTE — Progress Notes (Signed)
   S:    Patient arrives in good spirits.  Presents to the clinic for hypertension evaluation. Patient was referred on 10/10/16 by Dr. Armen PickupFunches.  Patient was last seen by Primary Care Provider on 10/10/16.   Patient reports that he was diagnosed with Shingles. He has the prescriptions he was given. He would like to see a provider today for further evaluation.  Patient reports adherence with medications.  Current BP Medications include:  none  Antihypertensives tried in the past include: none  Dietary habits include:   O:   Last 3 Office BP readings: BP Readings from Last 3 Encounters:  10/10/16 140/82  05/09/16 115/83  11/02/14 110/84    BMET    Component Value Date/Time   NA 145 05/09/2016 0932   K 4.2 05/09/2016 0932   CL 108 05/09/2016 0932   CO2 28 05/09/2016 0932   GLUCOSE 100 (H) 05/09/2016 0932   BUN 12 05/09/2016 0932   CREATININE 1.10 05/09/2016 0932   CALCIUM 9.2 05/09/2016 0932   GFRNONAA 81 05/09/2016 0932   GFRAA >89 05/09/2016 0932    A/P: Elevated blood pressure reading at last visit, blood pressure now currently elevated but may be due to shingles pain. Patient worked in to see walk in provider, will need blood pressure recheck during that visit to see if it has come down.  Results reviewed and written information provided.   Total time in face-to-face counseling 10 minutes.   F/U Clinic Visit with Dr. Armen PickupFunches as directed.

## 2016-11-29 ENCOUNTER — Other Ambulatory Visit: Payer: Self-pay

## 2016-11-29 DIAGNOSIS — N529 Male erectile dysfunction, unspecified: Secondary | ICD-10-CM

## 2016-11-29 MED ORDER — SILDENAFIL CITRATE 50 MG PO TABS: 50.0000 mg | ORAL_TABLET | Freq: Every day | ORAL | 3 refills | Status: DC | PRN

## 2016-12-04 MED FILL — !VIAGRA 50 MG TABLET: 50 MG | 30 days supply | Qty: 3 | Fill #0

## 2017-01-05 ENCOUNTER — Other Ambulatory Visit: Payer: Self-pay | Admitting: *Deleted

## 2017-01-05 MED ORDER — SILDENAFIL CITRATE 50 MG PO TABS: 50.0000 mg | ORAL_TABLET | Freq: Every day | ORAL | 3 refills | Status: DC | PRN

## 2017-01-05 NOTE — Telephone Encounter (Signed)
PRINTED FOR PASS PROGRAM 

## 2017-02-19 MED FILL — $Viagra 50mg tablet: 50 | 30 days supply | Qty: 10 | Fill #1

## 2017-04-03 ENCOUNTER — Other Ambulatory Visit: Payer: Self-pay

## 2017-04-03 ENCOUNTER — Encounter: Payer: Self-pay | Admitting: Physician Assistant

## 2017-04-03 ENCOUNTER — Ambulatory Visit: Payer: Medicaid Other | Attending: Internal Medicine | Admitting: Physician Assistant

## 2017-04-03 VITALS — BP 145/99 | HR 85 | Temp 97.6°F | Resp 20 | Ht 68.0 in | Wt 150.2 lb

## 2017-04-03 DIAGNOSIS — Z8673 Personal history of transient ischemic attack (TIA), and cerebral infarction without residual deficits: Secondary | ICD-10-CM | POA: Insufficient documentation

## 2017-04-03 DIAGNOSIS — R234 Changes in skin texture: Secondary | ICD-10-CM | POA: Insufficient documentation

## 2017-04-03 DIAGNOSIS — Z79899 Other long term (current) drug therapy: Secondary | ICD-10-CM | POA: Insufficient documentation

## 2017-04-03 DIAGNOSIS — W57XXXA Bitten or stung by nonvenomous insect and other nonvenomous arthropods, initial encounter: Secondary | ICD-10-CM

## 2017-04-03 DIAGNOSIS — L298 Other pruritus: Secondary | ICD-10-CM | POA: Insufficient documentation

## 2017-04-03 DIAGNOSIS — Z7982 Long term (current) use of aspirin: Secondary | ICD-10-CM | POA: Diagnosis not present

## 2017-04-03 DIAGNOSIS — E785 Hyperlipidemia, unspecified: Secondary | ICD-10-CM | POA: Diagnosis not present

## 2017-04-03 MED ORDER — TRIAMCINOLONE ACETONIDE 0.1 % EX CREA: 1.0000 "application " | TOPICAL_CREAM | Freq: Two times a day (BID) | CUTANEOUS | 3 refills | Status: DC

## 2017-04-03 MED ORDER — ATORVASTATIN CALCIUM 40 MG PO TABS: 40.0000 mg | ORAL_TABLET | Freq: Every day | ORAL | 11 refills | Status: DC

## 2017-04-03 MED ORDER — MUPIROCIN 2 % EX OINT: 1.0000 "application " | TOPICAL_OINTMENT | Freq: Two times a day (BID) | CUTANEOUS | 3 refills | Status: DC

## 2017-04-03 MED FILL — MUPIROCIN 2% OINTMENT: 2 | 30 days supply | Qty: 22 | Fill #0

## 2017-04-03 MED FILL — TRIAMCINOLONE ACETONIDE 0.1: 0.1 | 30 days supply | Qty: 30 | Fill #0

## 2017-04-03 MED FILL — ATORVASTATIN 40 MG TABLET: 40 | 30 days supply | Qty: 30 | Fill #0

## 2017-04-03 NOTE — Progress Notes (Signed)
Concerns with scratches and sores on skin

## 2017-04-03 NOTE — Progress Notes (Signed)
Patient ID: Ernest HackerJohn D Micale Jr., male   DOB: 08-03-1970, 46 y.o.   MRN: [id number]   Ernest Thomas, is a 46 y.o. male  [id number]  [id number]  DOB - 08-03-1970  Subjective:  Chief Complaint and HPI: Ernest Thomas is a 46 y.o. male here today for itching and scabbing skin.  Doesn't know of anything biting him/flea bites.  The areas do itch and are on his L elbow, L hand, R arm, and lower legs.  No f/c.  He hasn't been taking his cholesterol meds for some time bc he has been out of them.   ROS:   Constitutional:  No f/c, No night sweats, No unexplained weight loss. EENT:  No vision changes, No blurry vision, No hearing changes. No mouth, throat, or ear problems.  Respiratory: No cough, No SOB Cardiac: No CP, no palpitations GI:  No abd pain, No N/V/D. GU: No Urinary s/sx Musculoskeletal: No joint pain Neuro: No headache, no dizziness, no motor weakness.  Skin: +skin lesions Endocrine:  No polydipsia. No polyuria.  Psych: Denies SI/HI  No problems updated.  ALLERGIES: No Known Allergies  PAST MEDICAL HISTORY: Past Medical History:  Diagnosis Date  . Alcohol abuse    4 DUI, lost license many times  . Hyperlipidemia   . Stroke [facility](HCC) 2013   Acute left dorsolateral thalamus ischemic CVA, no residual sxs.    MEDICATIONS AT HOME: Prior to Admission medications   Medication Sig Start Date End Date Taking? Authorizing Provider  aspirin EC 81 MG tablet Take 1 tablet (81 mg total) by mouth daily. Patient not taking: Reported on 11/28/2016 10/10/16   Dessa PhiFunches, Josalyn, MD  atorvastatin (LIPITOR) 40 MG tablet Take 1 tablet (40 mg total) daily by mouth. 04/03/17   McClung, Marzella SchleinAngela M, PA-C  diphenhydrAMINE (BENADRYL) 25 MG tablet Take 25 mg by mouth every 6 (six) hours as needed.    [provider]  loratadine (CLARITIN) 10 MG tablet Take 1 tablet (10 mg total) by mouth daily. Patient not taking: Reported on 11/28/2016 10/10/16   Dessa PhiFunches, Josalyn, MD  Multiple  Vitamins-Minerals (MULTIVITAMIN) tablet Take 1 tablet by mouth daily. Patient not taking: Reported on 11/28/2016 05/09/16   Dessa PhiFunches, Josalyn, MD  mupirocin ointment (BACTROBAN) 2 % Apply 1 application 2 (two) times daily topically. 04/03/17   Anders SimmondsMcClung, Angela M, PA-C  sildenafil (REVATIO) 20 MG tablet Take 3 tablets (60 mg total) by mouth daily as needed (erectile dysfunction. take 30 minutes before sexual intercourse). Patient not taking: Reported on 04/03/2017 11/28/16   Vivianne MasterNoel, Tiffany S, PA-C  sildenafil (VIAGRA) 50 MG tablet Take 1 tablet (50 mg total) by mouth daily as needed for erectile dysfunction. Patient not taking: Reported on 04/03/2017 01/05/17   Quentin AngstJegede, Olugbemiga E, MD  triamcinolone cream (KENALOG) 0.1 % Apply 1 application 2 (two) times daily topically. 04/03/17   Anders SimmondsMcClung, Angela M, PA-C  valACYclovir (VALTREX) 1000 MG tablet Take 1,000 mg by mouth 3 (three) times daily.    [provider]     Objective:  EXAM:   Vitals:   04/03/17 0903  BP: (!) 145/99  Pulse: 85  Resp: 20  Temp: 97.6 F (36.4 C)  SpO2: 97%  Weight: 150 lb 3.2 oz (68.1 kg)  Height: 5\' 8"  (1.727 m)    General appearance : A&OX3. NAD. Non-toxic-appearing HEENT: Atraumatic and Normocephalic.  PERRLA. EOM intact.   Neck: supple, no JVD. No cervical lymphadenopathy. No thyromegaly Chest/Lungs:  Breathing-non-labored, Good air entry bilaterally, breath sounds normal without rales,  rhonchi, or wheezing  CVS: S1 S2 regular, no murmurs, gallops, rubs  Extremities: Bilateral Lower Ext shows no edema, both legs are warm to touch with = pulse throughout Neurology:  CN II-XII grossly intact, Non focal.   Psych:  TP linear. J/I WNL. Normal speech. Appropriate eye contact and affect.  Skin:  Some excoriated lesions on top of L hand, L elbow, lower legs in no specific pattern of distribution.  No sign secondary inection  Data Review Lab Results  Component Value Date   HGBA1C 5.2 04/01/2012      Assessment & Plan   1. History of ischemic stroke without residual deficits restart - atorvastatin (LIPITOR) 40 MG tablet; Take 1 tablet (40 mg total) daily by mouth.  Dispense: 30 tablet; Refill: 11  2. Hyperlipidemia with target low density lipoprotein (LDL) cholesterol less than 100 mg/dL restart - atorvastatin (LIPITOR) 40 MG tablet; Take 1 tablet (40 mg total) daily by mouth.  Dispense: 30 tablet; Refill: 11  3. ?insect bites - mupirocin ointment (BACTROBAN) 2 %; Apply 1 application 2 (two) times daily topically.  Dispense: 22 g; Refill: 3 for infection prevention - triamcinolone cream (KENALOG) 0.1 %; Apply 1 application 2 (two) times daily topically for itching.  Dispense: 30 g; Refill: 3   Patient have been counseled extensively about nutrition and exercise  Return in about 2 months (around 06/03/2017) for reassign to new PCP and check fasting lipids.  The patient was given clear instructions to go to ER or return to medical center if symptoms don't improve, worsen or new problems develop. The patient verbalized understanding. The patient was told to call to get lab results if they haven't heard anything in the next week.     Georgian CoAngela McClung, PA-C [facility] [place], [place] [phone]   04/03/2017, 9:21 AM

## 2017-06-04 ENCOUNTER — Ambulatory Visit: Payer: Medicaid Other | Admitting: Internal Medicine

## 2017-07-12 ENCOUNTER — Ambulatory Visit: Payer: Medicaid Other | Attending: Internal Medicine | Admitting: Physician Assistant

## 2017-07-12 VITALS — BP 117/89 | HR 109 | Temp 97.5°F | Resp 16 | Ht 68.0 in | Wt 148.2 lb

## 2017-07-12 DIAGNOSIS — Z7982 Long term (current) use of aspirin: Secondary | ICD-10-CM | POA: Insufficient documentation

## 2017-07-12 DIAGNOSIS — Z8673 Personal history of transient ischemic attack (TIA), and cerebral infarction without residual deficits: Secondary | ICD-10-CM | POA: Insufficient documentation

## 2017-07-12 DIAGNOSIS — K5900 Constipation, unspecified: Secondary | ICD-10-CM

## 2017-07-12 DIAGNOSIS — Z79899 Other long term (current) drug therapy: Secondary | ICD-10-CM | POA: Insufficient documentation

## 2017-07-12 DIAGNOSIS — E785 Hyperlipidemia, unspecified: Secondary | ICD-10-CM | POA: Diagnosis not present

## 2017-07-12 MED ORDER — POLYETHYLENE GLYCOL 3350 17 GM/SCOOP PO POWD: 17.0000 g | Freq: Two times a day (BID) | ORAL | 1 refills | Status: DC | PRN

## 2017-07-12 MED FILL — POLYETHYLENE GLYCOL 3350 PO: 30 days supply | Qty: 952 | Fill #0

## 2017-07-12 NOTE — Progress Notes (Signed)
Patient stated he's been constipated for 4 days.   Have pain when he try to push out.  Tried stool softener but no help.

## 2017-07-12 NOTE — Progress Notes (Signed)
Patient ID: Merryl HackerJohn D Bille Jr., male   DOB: Aug 26, 1970, 47 y.o.   MRN: [id number]   Elita BooneJohn Heinzman, is a 47 y.o. male  [id number]  [id number]  DOB - Aug 26, 1970  Subjective:  Chief Complaint and HPI: Elita BooneJohn Abshier is a 47 y.o. male here today for 4 day h/o constipation.  He thinks it is because he drank a lot of milk.  No other diet changes. He denies melena or hematochezia.  No abdominal pain.  Poor historian.  No CP/SOB.  No change in BM overall until 4 days ago.  He has been able to pass small lumps of stool.  Doesn't drink water at all.  Has taken a stool softener and taken an ex-lax.    No FH colon CA/GI tumors.    ROS:   Constitutional:  No f/c, No night sweats, No unexplained weight loss. EENT:  No vision changes, No blurry vision, No hearing changes. No mouth, throat, or ear problems.  Respiratory: No cough, No SOB Cardiac: No CP, no palpitations GI:  No abd pain, No N/V/D. + constipation GU: No Urinary s/sx Musculoskeletal: No joint pain Neuro: No headache, no dizziness, no motor weakness.  Skin: No rash Endocrine:  No polydipsia. No polyuria.  Psych: Denies SI/HI  No problems updated.  ALLERGIES: No Known Allergies  PAST MEDICAL HISTORY: Past Medical History:  Diagnosis Date  . Alcohol abuse    4 DUI, lost license many times  . Hyperlipidemia   . Stroke [facility](HCC) 2013   Acute left dorsolateral thalamus ischemic CVA, no residual sxs.    MEDICATIONS AT HOME: Prior to Admission medications   Medication Sig Start Date End Date Taking? Authorizing Provider  aspirin EC 81 MG tablet Take 1 tablet (81 mg total) by mouth daily. Patient not taking: Reported on 11/28/2016 10/10/16   Dessa PhiFunches, Josalyn, MD  atorvastatin (LIPITOR) 40 MG tablet Take 1 tablet (40 mg total) daily by mouth. Patient not taking: Reported on 07/12/2017 04/03/17   Anders SimmondsMcClung, Crawford Tamura M, PA-C  diphenhydrAMINE (BENADRYL) 25 MG tablet Take 25 mg by mouth every 6 (six) hours as needed.    [provider]  loratadine (CLARITIN) 10 MG tablet Take 1 tablet (10 mg total) by mouth daily. Patient not taking: Reported on 11/28/2016 10/10/16   Dessa PhiFunches, Josalyn, MD  Multiple Vitamins-Minerals (MULTIVITAMIN) tablet Take 1 tablet by mouth daily. Patient not taking: Reported on 11/28/2016 05/09/16   Dessa PhiFunches, Josalyn, MD  mupirocin ointment (BACTROBAN) 2 % Apply 1 application 2 (two) times daily topically. Patient not taking: Reported on 07/12/2017 04/03/17   Anders SimmondsMcClung, Nguyen Todorov M, PA-C  polyethylene glycol powder (GLYCOLAX/MIRALAX) powder Take 17 g by mouth 2 (two) times daily as needed. Until constipation improves 07/12/17   Georgian CoMcClung, Katrenia Alkins M, PA-C  sildenafil (REVATIO) 20 MG tablet Take 3 tablets (60 mg total) by mouth daily as needed (erectile dysfunction. take 30 minutes before sexual intercourse). Patient not taking: Reported on 04/03/2017 11/28/16   Vivianne MasterNoel, Tiffany S, PA-C  sildenafil (VIAGRA) 50 MG tablet Take 1 tablet (50 mg total) by mouth daily as needed for erectile dysfunction. Patient not taking: Reported on 04/03/2017 01/05/17   Quentin AngstJegede, Olugbemiga E, MD  triamcinolone cream (KENALOG) 0.1 % Apply 1 application 2 (two) times daily topically. Patient not taking: Reported on 07/12/2017 04/03/17   Anders SimmondsMcClung, Roc Streett M, PA-C  valACYclovir (VALTREX) 1000 MG tablet Take 1,000 mg by mouth 3 (three) times daily.    [provider]     Objective:  EXAM:  Vitals:   07/12/17 0929  BP: 117/89  Pulse: (!) 109  Resp: 16  Temp: (!) 97.5 F (36.4 C)  TempSrc: Oral  SpO2: 98%  Weight: 148 lb 3.2 oz (67.2 kg)  Height: 5\' 8"  (1.727 m)    General appearance : A&OX3. NAD. Non-toxic-appearing HEENT: Atraumatic and Normocephalic.  PERRLA. EOM intact.  TM clear B. Mouth-MMM, post pharynx WNL w/o erythema, No PND. Neck: supple, no JVD. No cervical lymphadenopathy. No thyromegaly Chest/Lungs:  Breathing-non-labored, Good air entry bilaterally, breath sounds normal without rales, rhonchi, or  wheezing.  Rate 92 on exam CVS: S1 S2 regular, no murmurs, gallops, rubs  Abdomen: Bowel sounds present, firm and not distended with no gaurding, rigidity or rebound. Extremities: Bilateral Lower Ext shows no edema, both legs are warm to touch with = pulse throughout Neurology:  CN II-XII grossly intact, Non focal.   Psych:  J/I limited. Normal speech. Appropriate eye contact and blunted affect.  Skin:  No Rash  Data Review Lab Results  Component Value Date   HGBA1C 5.2 04/01/2012     Assessment & Plan   1. Constipation, unspecified constipation type Non-acute abdomen.  To ED if pain increases, fever develops or unable to have BM after increasing water and taking miralax.   - Comprehensive metabolic panel - TSH - polyethylene glycol powder (GLYCOLAX/MIRALAX) powder; Take 17 g by mouth 2 (two) times daily as needed. Until constipation improves  Dispense: 3350 g; Refill: 1   Patient have been counseled extensively about nutrition and exercise  Return in about 3 months (around 10/09/2017) for assign PCP.  The patient was given clear instructions to go to ER or return to medical center if symptoms don't improve, worsen or new problems develop. The patient verbalized understanding. The patient was told to call to get lab results if they haven't heard anything in the next week.     Georgian Co, PA-C [facility] [place], [place] [phone]   07/12/2017, 9:47 AM

## 2017-07-12 NOTE — Patient Instructions (Signed)
Drink 80-100 ounces water daily.     Constipation, Adult Constipation is when a person:  Poops (has a bowel movement) fewer times in a week than normal.  Has a hard time pooping.  Has poop that is dry, hard, or bigger than normal.  Follow these instructions at home: Eating and drinking   Eat foods that have a lot of fiber, such as: ? Fresh fruits and vegetables. ? Whole grains. ? Beans.  Eat less of foods that are high in fat, low in fiber, or overly processed, such as: ? Jamaica[place] fries. ? Hamburgers. ? Cookies. ? Candy. ? Soda.  Drink enough fluid to keep your pee (urine) clear or pale yellow. General instructions  Exercise regularly or as told by your doctor.  Go to the restroom when you feel like you need to poop. Do not hold it in.  Take over-the-counter and prescription medicines only as told by your doctor. These include any fiber supplements.  Do pelvic floor retraining exercises, such as: ? Doing deep breathing while relaxing your lower belly (abdomen). ? Relaxing your pelvic floor while pooping.  Watch your condition for any changes.  Keep all follow-up visits as told by your doctor. This is important. Contact a doctor if:  You have pain that gets worse.  You have a fever.  You have not pooped for 4 days.  You throw up (vomit).  You are not hungry.  You lose weight.  You are bleeding from the anus.  You have thin, pencil-like poop (stool). Get help right away if:  You have a fever, and your symptoms suddenly get worse.  You leak poop or have blood in your poop.  Your belly feels hard or bigger than normal (is bloated).  You have very bad belly pain.  You feel dizzy or you faint. This information is not intended to replace advice given to you by your health care provider. Make sure you discuss any questions you have with your health care provider. Document Released: 10/25/2007 Document Revised: 11/26/2015 Document Reviewed:  10/27/2015 Elsevier Interactive Patient Education  2018 [employer]

## 2017-07-13 LAB — COMPREHENSIVE METABOLIC PANEL
ALT: 58 IU/L — ABNORMAL HIGH (ref 0–44)
AST: 67 IU/L — ABNORMAL HIGH (ref 0–40)
Albumin/Globulin Ratio: 1.7 (ref 1.2–2.2)
Albumin: 4.6 g/dL (ref 3.5–5.5)
Alkaline Phosphatase: 126 IU/L — ABNORMAL HIGH (ref 39–117)
BUN/Creatinine Ratio: 12 (ref 9–20)
BUN: 16 mg/dL (ref 6–24)
Bilirubin Total: 0.5 mg/dL (ref 0.0–1.2)
CO2: 25 mmol/L (ref 20–29)
Calcium: 10 mg/dL (ref 8.7–10.2)
Chloride: 99 mmol/L (ref 96–106)
Creatinine, Ser: 1.39 mg/dL — ABNORMAL HIGH (ref 0.76–1.27)
GFR calc Af Amer: 70 mL/min/{1.73_m2} (ref >59)
GFR calc non Af Amer: 60 mL/min/{1.73_m2} (ref >59)
Globulin, Total: 2.7 g/dL (ref 1.5–4.5)
Glucose: 105 mg/dL — ABNORMAL HIGH (ref 65–99)
Potassium: 4.9 mmol/L (ref 3.5–5.2)
Sodium: 143 mmol/L (ref 134–144)
Total Protein: 7.3 g/dL (ref 6.0–8.5)

## 2017-07-13 LAB — TSH: TSH: 2.77 u[IU]/mL (ref 0.450–4.500)

## 2017-07-15 ENCOUNTER — Encounter (HOSPITAL_COMMUNITY): Payer: Self-pay | Admitting: Emergency Medicine

## 2017-07-15 ENCOUNTER — Emergency Department (HOSPITAL_COMMUNITY)
Admission: EM | Admit: 2017-07-15 | Discharge: 2017-07-15 | Disposition: A | Discharge status: Home / Self Care | Payer: Medicaid Other | Attending: Emergency Medicine | Admitting: Emergency Medicine

## 2017-07-15 DIAGNOSIS — Z79899 Other long term (current) drug therapy: Secondary | ICD-10-CM | POA: Diagnosis not present

## 2017-07-15 DIAGNOSIS — F17210 Nicotine dependence, cigarettes, uncomplicated: Secondary | ICD-10-CM | POA: Diagnosis not present

## 2017-07-15 DIAGNOSIS — K5900 Constipation, unspecified: Secondary | ICD-10-CM | POA: Diagnosis not present

## 2017-07-15 DIAGNOSIS — Z7982 Long term (current) use of aspirin: Secondary | ICD-10-CM | POA: Insufficient documentation

## 2017-07-15 MED ORDER — BISACODYL 10 MG RE SUPP
10.0000 mg | Freq: Once | RECTAL | Status: AC
  Administered 2017-07-15: 10 mg via RECTAL
  Filled 2017-07-15: qty 1

## 2017-07-15 NOTE — Discharge Instructions (Signed)
It was our pleasure to provide your ER care today - we hope that you feel better.  Drink plenty of water/fluids. Get adequate fiber in your diet. Take colace (stool softener) 2x/day. Take miralax (laxative) as need.   Follow up with primary care doctor in the coming week.  Return to ER if worse, new symptoms, fevers, persistent vomiting, severe abdominal pain, other concern.

## 2017-07-15 NOTE — ED Notes (Signed)
Now stating he has had loose, watery stool. Pt poor historian. Hx of stroke.

## 2017-07-15 NOTE — ED Triage Notes (Signed)
Pt to ER for evaluation of constipation, states last BM 5 days ago. NAD. A/o x4. Denies abdominal pain. States pain with straining to have BM.

## 2017-07-15 NOTE — ED Provider Notes (Signed)
[facility] EMERGENCY DEPARTMENT Provider Note   CSN: [id number] Arrival date & time: 07/15/17  4098     History   Chief Complaint Chief Complaint  Patient presents with  . Constipation    HPI Ernest Thomas. is a 47 y.o. male.  Patient c/o feeling constipated for past 4-5 days. Symptoms moderate, persistent.  States last full/normal bm was 5 days ago. Went to pcp, tried miralax, states did have loose/watery stool x 1 since. Denies abd pain or distension. No vomiting. Normal appetite. No fever. Denies dysuria or gu c/o. Prior abd surgery.    The history is provided by the patient.  Constipation   Pertinent negatives include no abdominal pain and no dysuria.    Past Medical History:  Diagnosis Date  . Alcohol abuse    4 DUI, lost license many times  . Hyperlipidemia   . Stroke [facility]) 2013   Acute left dorsolateral thalamus ischemic CVA, no residual sxs.    Patient Active Problem List   Diagnosis Date Noted  . Erectile dysfunction 10/10/2016  . Tick bite of abdomen 10/10/2016  . Elevated BP without diagnosis of hypertension 10/10/2016  . Tobacco abuse 04/08/2012  . Hyperlipidemia with target low density lipoprotein (LDL) cholesterol less than 100 mg/dL 11/91/4782  . Teeth decayed 04/08/2012  . History of ischemic stroke without residual deficits 04/01/2012  . Alcohol abuse 04/01/2012    History reviewed. No pertinent surgical history.     Home Medications    Prior to Admission medications   Medication Sig Start Date End Date Taking? Authorizing Provider  aspirin EC 81 MG tablet Take 1 tablet (81 mg total) by mouth daily. Patient not taking: Reported on 11/28/2016 10/10/16   Dessa Phi, MD  atorvastatin (LIPITOR) 40 MG tablet Take 1 tablet (40 mg total) daily by mouth. Patient not taking: Reported on 07/12/2017 04/03/17   Anders Simmonds, PA-C  diphenhydrAMINE (BENADRYL) 25 MG tablet Take 25 mg by mouth every 6 (six) hours as needed.     [provider]  loratadine (CLARITIN) 10 MG tablet Take 1 tablet (10 mg total) by mouth daily. Patient not taking: Reported on 11/28/2016 10/10/16   Dessa Phi, MD  Multiple Vitamins-Minerals (MULTIVITAMIN) tablet Take 1 tablet by mouth daily. Patient not taking: Reported on 11/28/2016 05/09/16   Dessa Phi, MD  mupirocin ointment (BACTROBAN) 2 % Apply 1 application 2 (two) times daily topically. Patient not taking: Reported on 07/12/2017 04/03/17   Anders Simmonds, PA-C  polyethylene glycol powder (GLYCOLAX/MIRALAX) powder Take 17 g by mouth 2 (two) times daily as needed. Until constipation improves 07/12/17   Georgian Co M, PA-C  sildenafil (REVATIO) 20 MG tablet Take 3 tablets (60 mg total) by mouth daily as needed (erectile dysfunction. take 30 minutes before sexual intercourse). Patient not taking: Reported on 04/03/2017 11/28/16   Vivianne Master, PA-C  sildenafil (VIAGRA) 50 MG tablet Take 1 tablet (50 mg total) by mouth daily as needed for erectile dysfunction. Patient not taking: Reported on 04/03/2017 01/05/17   Quentin Angst, MD  triamcinolone cream (KENALOG) 0.1 % Apply 1 application 2 (two) times daily topically. Patient not taking: Reported on 07/12/2017 04/03/17   Anders Simmonds, PA-C  valACYclovir (VALTREX) 1000 MG tablet Take 1,000 mg by mouth 3 (three) times daily.    [provider]    Family History Family History  Problem Relation Age of Onset  . Diabetes Mother   . Cancer Mother  ovarian  . Hypertension Mother   . Deep vein thrombosis Mother   . Stroke Mother   . Multiple sclerosis Mother   . Diabetes Maternal Aunt   . Stroke Paternal Aunt   . Cancer Maternal Aunt        lung    Social History Social History   Tobacco Use  . Smoking status: Current Every Day Smoker    Packs/day: 0.25    Types: Cigarettes  . Smokeless tobacco: Never Used  Substance Use Topics  . Alcohol use: Yes    Alcohol/week: 0.6 oz     Types: 1 Cans of beer per week  . Drug use: No    Comment: THC in past     Allergies   Patient has no known allergies.   Review of Systems Review of Systems  Constitutional: Negative for fever.  HENT: Negative for sore throat.   Eyes: Negative for redness.  Respiratory: Negative for shortness of breath.   Cardiovascular: Negative for chest pain.  Gastrointestinal: Positive for constipation. Negative for abdominal pain and vomiting.  Genitourinary: Negative for dysuria.  Musculoskeletal: Negative for back pain.  Skin: Negative for rash.  Neurological: Negative for headaches.  Hematological: Does not bruise/bleed easily.  Psychiatric/Behavioral: Negative for confusion.     Physical Exam Updated Vital Signs BP (!) 144/108 (BP Location: Right Arm)   Pulse (!) 113   Temp 97.8 F (36.6 C) (Oral)   Resp 18   SpO2 98%   Physical Exam  Constitutional: He appears well-developed and well-nourished. No distress.  HENT:  Mouth/Throat: Oropharynx is clear and moist.  Eyes: Conjunctivae are normal.  Neck: Neck supple. No tracheal deviation present.  Cardiovascular: Normal rate.  Pulmonary/Chest: Effort normal. No accessory muscle usage. No respiratory distress.  Abdominal: Soft. Bowel sounds are normal. He exhibits no distension and no mass. There is no tenderness. There is no rebound and no guarding. No hernia.  No incarcerated hernia.  Genitourinary:  Genitourinary Comments: Rectal exam with large firm amount stool in rectum. Manually disimpacted small amount. No mass felt. Stool light brown in color. No blood.   Musculoskeletal: He exhibits no edema.  Neurological: He is alert.  Skin: Skin is warm and dry. He is not diaphoretic.  Psychiatric: He has a normal mood and affect.  Nursing note and vitals reviewed.    ED Treatments / Results  Labs (all labs ordered are listed, but only abnormal results are displayed) Results for orders placed or performed in visit on 07/12/17   Comprehensive metabolic panel  Result Value Ref Range   Glucose 105 (H) 65 - 99 mg/dL   BUN 16 6 - 24 mg/dL   Creatinine, Ser 1.61 (H) 0.76 - 1.27 mg/dL   GFR calc non Af Amer 60 >59 mL/min/1.73   GFR calc Af Amer 70 >59 mL/min/1.73   BUN/Creatinine Ratio 12 9 - 20   Sodium 143 134 - 144 mmol/L   Potassium 4.9 3.5 - 5.2 mmol/L   Chloride 99 96 - 106 mmol/L   CO2 25 20 - 29 mmol/L   Calcium 10.0 8.7 - 10.2 mg/dL   Total Protein 7.3 6.0 - 8.5 g/dL   Albumin 4.6 3.5 - 5.5 g/dL   Globulin, Total 2.7 1.5 - 4.5 g/dL   Albumin/Globulin Ratio 1.7 1.2 - 2.2   Bilirubin Total 0.5 0.0 - 1.2 mg/dL   Alkaline Phosphatase 126 (H) 39 - 117 IU/L   AST 67 (H) 0 - 40 IU/L   ALT 58 (  H) 0 - 44 IU/L  TSH  Result Value Ref Range   TSH 2.770 0.450 - 4.500 uIU/mL   EKG  EKG Interpretation None       Radiology No results found.  Procedures Procedures (including critical care time)  Medications Ordered in ED Medications  bisacodyl (DULCOLAX) suppository 10 mg (not administered)     Initial Impression / Assessment and Plan / ED Course  I have reviewed the triage vital signs and the nursing notes.  Pertinent labs & imaging results that were available during my care of the patient were reviewed by me and considered in my medical decision making (see chart for details).  Labs sent from triage.  abd exam benign.   Reviewed nursing notes and prior charts for additional history.   Small amount stool disimpacted/removed, remained loosened/broken up.  Dulcolax suppository.  Patient with bm in ED and feels improved.   Final Clinical Impressions(s) / ED Diagnoses   Final diagnoses:  None    ED Discharge Orders    None       Cathren LaineSteinl, Greysen Swanton, MD 07/15/17 1007

## 2017-07-16 ENCOUNTER — Telehealth: Payer: Self-pay

## 2017-07-16 NOTE — Telephone Encounter (Signed)
CMA attempt to call patient regarding lab results.  Patient did not answer and unable to leave voicemail due to full.  Letter will be send out to patient.  If patient call back please inform:  Please call patient.  His labs show mild dehydration which would certainly contribute to constipation.  He should drink more water as we discussed yesterday.  His liver function is a little impaired so he should avoid alcohol and tylenol products.  Follow up as planned. Thanks! Georgian CoAngela McClung, PA-C

## 2017-07-16 NOTE — Telephone Encounter (Signed)
-----   Message from Anders SimmondsAngela M McClung, [place]PA-C sent at 07/13/2017 10:22 AM EST ----- Please call patient.  His labs show mild dehydration which would certainly contribute to constipation.  He should drink more water as we discussed yesterday.  His liver function is a little impaired so he should avoid alcohol and tylenol products.  Follow up as planned. Thanks! Georgian CoAngela McClung, PA-C

## 2017-07-19 ENCOUNTER — Telehealth: Payer: Self-pay | Admitting: General Practice

## 2017-07-19 NOTE — Telephone Encounter (Signed)
NOTED

## 2017-07-19 NOTE — Telephone Encounter (Signed)
Pt called and was given the lab results the CMA left on file.

## 2017-09-24 MED FILL — $Viagra 50mg tablet: 50 | 30 days supply | Qty: 10 | Fill #2

## 2017-10-09 ENCOUNTER — Ambulatory Visit: Payer: Medicaid Other | Admitting: Nurse Practitioner

## 2017-11-27 MED FILL — $Viagra 50mg tablet: 50 | 30 days supply | Qty: 10 | Fill #3

## 2018-01-14 DIAGNOSIS — R5383 Other fatigue: Secondary | ICD-10-CM | POA: Diagnosis not present

## 2018-02-12 DIAGNOSIS — R799 Abnormal finding of blood chemistry, unspecified: Secondary | ICD-10-CM | POA: Diagnosis not present

## 2018-02-13 DIAGNOSIS — R05 Cough: Secondary | ICD-10-CM | POA: Diagnosis not present

## 2018-02-13 DIAGNOSIS — Z87891 Personal history of nicotine dependence: Secondary | ICD-10-CM | POA: Diagnosis not present

## 2018-10-30 DIAGNOSIS — H16223 Keratoconjunctivitis sicca, not specified as Sjogren's, bilateral: Secondary | ICD-10-CM | POA: Diagnosis not present

## 2019-02-11 ENCOUNTER — Other Ambulatory Visit: Payer: Self-pay

## 2019-02-11 ENCOUNTER — Ambulatory Visit: Payer: Medicaid Other | Attending: Nurse Practitioner | Admitting: Nurse Practitioner

## 2019-02-11 ENCOUNTER — Encounter: Payer: Self-pay | Admitting: Nurse Practitioner

## 2019-02-11 DIAGNOSIS — Z7689 Persons encountering health services in other specified circumstances: Secondary | ICD-10-CM | POA: Diagnosis not present

## 2019-02-11 DIAGNOSIS — Z131 Encounter for screening for diabetes mellitus: Secondary | ICD-10-CM

## 2019-02-11 DIAGNOSIS — Z8673 Personal history of transient ischemic attack (TIA), and cerebral infarction without residual deficits: Secondary | ICD-10-CM | POA: Diagnosis not present

## 2019-02-11 DIAGNOSIS — L299 Pruritus, unspecified: Secondary | ICD-10-CM

## 2019-02-11 DIAGNOSIS — F17210 Nicotine dependence, cigarettes, uncomplicated: Secondary | ICD-10-CM | POA: Diagnosis not present

## 2019-02-11 DIAGNOSIS — F1010 Alcohol abuse, uncomplicated: Secondary | ICD-10-CM | POA: Diagnosis not present

## 2019-02-11 DIAGNOSIS — Z0000 Encounter for general adult medical examination without abnormal findings: Secondary | ICD-10-CM | POA: Insufficient documentation

## 2019-02-11 DIAGNOSIS — N529 Male erectile dysfunction, unspecified: Secondary | ICD-10-CM | POA: Diagnosis not present

## 2019-02-11 DIAGNOSIS — R7989 Other specified abnormal findings of blood chemistry: Secondary | ICD-10-CM | POA: Diagnosis not present

## 2019-02-11 DIAGNOSIS — R945 Abnormal results of liver function studies: Secondary | ICD-10-CM | POA: Diagnosis not present

## 2019-02-11 DIAGNOSIS — E785 Hyperlipidemia, unspecified: Secondary | ICD-10-CM | POA: Diagnosis not present

## 2019-02-11 MED ORDER — ATORVASTATIN CALCIUM 40 MG PO TABS: 40.0000 mg | ORAL_TABLET | Freq: Every day | ORAL | 3 refills | Status: DC

## 2019-02-11 MED ORDER — SILDENAFIL CITRATE 100 MG PO TABS: 50.0000 mg | ORAL_TABLET | Freq: Every day | ORAL | 3 refills | Status: DC | PRN

## 2019-02-11 MED ORDER — DIPHENHYDRAMINE HCL 25 MG PO TABS: 25.0000 mg | ORAL_TABLET | Freq: Three times a day (TID) | ORAL | 1 refills | Status: DC | PRN

## 2019-02-11 MED FILL — ATORVASTATIN CALCIUM 40 MG: 40 | 90 days supply | Qty: 90 | Fill #0

## 2019-02-11 NOTE — Progress Notes (Signed)
Virtual Visit via Telephone Note Due to national recommendations of social distancing due to [place] 19, telehealth visit is felt to be most appropriate for this patient at this time.  I discussed the limitations, risks, security and privacy concerns of performing an evaluation and management service by telephone and the availability of in person appointments. I also discussed with the patient that there may be a patient responsible charge related to this service. The patient expressed understanding and agreed to proceed.    I connected with Ernest Thomas. on 02/11/19  at  10:30 AM EDT  EDT by telephone and verified that I am speaking with the correct person using two identifiers.   Consent I discussed the limitations, risks, security and privacy concerns of performing an evaluation and management service by telephone and the availability of in person appointments. I also discussed with the patient that there may be a patient responsible charge related to this service. The patient expressed understanding and agreed to proceed.   Location of Patient: Private Residence   Location of Provider: [place] and [place] participating in Telemedicine visit: Ernest Rankins FNP-BC [place].    History of Present Illness: Telemedicine visit for: Re establish care  has a past medical history of Alcohol abuse, Hyperlipidemia, and Stroke ([place]) (2013) with no residual deficit.  He continues to smoke. Smoking 1/2 ppd. He denies any history of Diabetes mellitus or thyroid disorder.  He does endorse a history of elevated blood pressure without a diagnosis of hypertension in the past.  SKIN PROBLEM Patient complains of generalized Pruritus with onset several months ago. He does have a history of alcohol abuse and has not stopped drinking. There is no rash involved with the itching.   Associated symptoms: none. Denies: fever, nausea and vomiting.   Patient has not identified precipitant. Patient has not had new exposures (soaps, lotions, laundry detergents, foods, medications, plants, insects or animals.)  He uses liquid soap for bathing and takes hot showers before bed. Itching is worse at night however he denies any lesions or sores that appear to be bug bites on his body.      Past Medical History:  Diagnosis Date  . Alcohol abuse    4 DUI, lost license many times  . History of shingles   . Hyperlipidemia   . Stroke [facility]) 2013   Acute left dorsolateral thalamus ischemic CVA, no residual sxs.    History reviewed. No pertinent surgical history.  Family History  Problem Relation Age of Onset  . Diabetes Mother   . Cancer Mother        ovarian  . Hypertension Mother   . Deep vein thrombosis Mother   . Stroke Mother   . Multiple sclerosis Mother   . Diabetes Maternal Aunt   . Stroke Paternal Aunt   . Cancer Maternal Aunt        lung    Social History   Socioeconomic History  . Marital status: Married    Spouse name: Not on file  . Number of children: Not on file  . Years of education: Not on file  . Highest education level: Not on file  Occupational History  . Not on file  Social Needs  . Financial resource strain: Not on file  . Food insecurity    Worry: Not on file    Inability: Not on file  . Transportation needs    Medical: Not on  file    Non-medical: Not on file  Tobacco Use  . Smoking status: Current Every Day Smoker    Packs/day: 0.25    Types: Cigarettes  . Smokeless tobacco: Never Used  Substance and Sexual Activity  . Alcohol use: Yes    Alcohol/week: 1.0 standard drinks    Types: 1 Cans of beer per week  . Drug use: No    Comment: THC in past  . Sexual activity: Yes  Lifestyle  . Physical activity    Days per week: Not on file    Minutes per session: Not on file  . Stress: Not on file  Relationships  . Social Herbalist on phone: Not on file    Gets together: Not on file     Attends religious service: Not on file    Active member of club or organization: Not on file    Attends meetings of clubs or organizations: Not on file    Relationship status: Not on file  Other Topics Concern  . Not on file  Social History Narrative   Lives alone in house supported by father. Gets food stamps. Single. No kids. Used to work in [employer] - unemployed since about 2000. Has license but lost it 3 times for 4 DUI's. Large family around him.     Observations/Objective: Awake, alert and oriented x 3   Review of Systems  Constitutional: Negative for fever, malaise/fatigue and weight loss.  HENT: Negative.  Negative for nosebleeds.   Eyes: Negative.  Negative for blurred vision, double vision and photophobia.  Respiratory: Negative.  Negative for cough and shortness of breath.   Cardiovascular: Negative.  Negative for chest pain, palpitations and leg swelling.  Gastrointestinal: Positive for heartburn. Negative for nausea and vomiting.  Musculoskeletal: Negative.  Negative for myalgias.  Skin: Positive for itching. Negative for rash.  Neurological: Negative.  Negative for dizziness, focal weakness, seizures and headaches.  Psychiatric/Behavioral: Positive for substance abuse. Negative for suicidal ideas.    Assessment and Plan: Aarish was seen today for establish care.  Diagnoses and all orders for this visit:  Encounter to establish care  Elevated LFTs -     CMP14+EGFR; Future -     Hepatitis C Antibody; Future  Alcohol abuse -     Hepatitis C Antibody; Future Patient declines any substance abuse referral or reading material  Hyperlipidemia with target low density lipoprotein (LDL) cholesterol less than 100 mg/dL -     Lipid panel; Future -     atorvastatin (LIPITOR) 40 MG tablet; Take 1 tablet (40 mg total) by mouth daily. Lab Results  Component Value Date   LDLCALC 181 (H) 05/09/2016  LDL NOT AT GOAL Unable to obtain fasting lipid panel today as patient  reports he ate spaghetti and meatballs prior to his appointment.  History of ischemic stroke without residual deficits -     CBC; Future -     atorvastatin (LIPITOR) 40 MG tablet; Take 1 tablet (40 mg total) by mouth daily.  Diabetes mellitus screening -     Hemoglobin A1c; Future  Pruritic condition -     diphenhydrAMINE (BENADRYL) 25 MG tablet; Take 1 tablet (25 mg total) by mouth every 8 (eight) hours as needed for itching.  Erectile dysfunction, unspecified erectile dysfunction type -     sildenafil (VIAGRA) 100 MG tablet; Take 0.5-1 tablets (50-100 mg total) by mouth daily as needed for erectile dysfunction.     Follow Up Instructions Return in  about 6 weeks (around 03/25/2019) for BP check.     I discussed the assessment and treatment plan with the patient. The patient was provided an opportunity to ask questions and all were answered. The patient agreed with the plan and demonstrated an understanding of the instructions.   The patient was advised to call back or seek an in-person evaluation if the symptoms worsen or if the condition fails to improve as anticipated.  I provided 23 minutes of non-face-to-face time during this encounter including median intraservice time, reviewing previous notes, labs, imaging, medications and explaining diagnosis and management.  Gildardo Pounds, FNP-BC

## 2019-02-19 ENCOUNTER — Other Ambulatory Visit: Payer: Self-pay

## 2019-02-19 ENCOUNTER — Ambulatory Visit: Payer: Medicaid Other | Attending: Nurse Practitioner

## 2019-02-19 DIAGNOSIS — Z8673 Personal history of transient ischemic attack (TIA), and cerebral infarction without residual deficits: Secondary | ICD-10-CM

## 2019-02-19 DIAGNOSIS — F1010 Alcohol abuse, uncomplicated: Secondary | ICD-10-CM

## 2019-02-19 DIAGNOSIS — R7989 Other specified abnormal findings of blood chemistry: Secondary | ICD-10-CM

## 2019-02-19 DIAGNOSIS — E785 Hyperlipidemia, unspecified: Secondary | ICD-10-CM

## 2019-02-19 DIAGNOSIS — Z131 Encounter for screening for diabetes mellitus: Secondary | ICD-10-CM

## 2019-02-20 LAB — CMP14+EGFR
ALT: 19 IU/L (ref 0–44)
AST: 30 IU/L (ref 0–40)
Albumin/Globulin Ratio: 1.6 (ref 1.2–2.2)
Albumin: 4.1 g/dL (ref 4.0–5.0)
Alkaline Phosphatase: 92 IU/L (ref 39–117)
BUN/Creatinine Ratio: 11 (ref 9–20)
BUN: 12 mg/dL (ref 6–24)
Bilirubin Total: 0.3 mg/dL (ref 0.0–1.2)
CO2: 28 mmol/L (ref 20–29)
Calcium: 9.5 mg/dL (ref 8.7–10.2)
Chloride: 107 mmol/L — ABNORMAL HIGH (ref 96–106)
Creatinine, Ser: 1.05 mg/dL (ref 0.76–1.27)
GFR calc Af Amer: 97 mL/min/{1.73_m2} (ref >59)
GFR calc non Af Amer: 84 mL/min/{1.73_m2} (ref >59)
Globulin, Total: 2.5 g/dL (ref 1.5–4.5)
Glucose: 106 mg/dL — ABNORMAL HIGH (ref 65–99)
Potassium: 5.5 mmol/L — ABNORMAL HIGH (ref 3.5–5.2)
Sodium: 146 mmol/L — ABNORMAL HIGH (ref 134–144)
Total Protein: 6.6 g/dL (ref 6.0–8.5)

## 2019-02-20 LAB — CBC
Hematocrit: 46.7 % (ref 37.5–51.0)
Hemoglobin: 16.3 g/dL (ref 13.0–17.7)
MCH: 36.9 pg — ABNORMAL HIGH (ref 26.6–33.0)
MCHC: 34.9 g/dL (ref 31.5–35.7)
MCV: 106 fL — ABNORMAL HIGH (ref 79–97)
Platelets: 300 10*3/uL (ref 150–450)
RBC: 4.42 x10E6/uL (ref 4.14–5.80)
RDW: 13.8 % (ref 11.6–15.4)
WBC: 6.8 10*3/uL (ref 3.4–10.8)

## 2019-02-20 LAB — HEMOGLOBIN A1C
Est. average glucose Bld gHb Est-mCnc: 111 mg/dL
Hgb A1c MFr Bld: 5.5 % (ref 4.8–5.6)

## 2019-02-20 LAB — LIPID PANEL
Chol/HDL Ratio: 6.5 ratio — ABNORMAL HIGH (ref 0.0–5.0)
Cholesterol, Total: 334 mg/dL — ABNORMAL HIGH (ref 100–199)
HDL: 51 mg/dL (ref >39)
LDL Chol Calc (NIH): 227 mg/dL — ABNORMAL HIGH (ref 0–99)
Triglycerides: 273 mg/dL — ABNORMAL HIGH (ref 0–149)
VLDL Cholesterol Cal: 56 mg/dL — ABNORMAL HIGH (ref 5–40)

## 2019-02-20 LAB — HEPATITIS C ANTIBODY: Hep C Virus Ab: <0.1 s/co ratio (ref 0.0–0.9)

## 2019-02-23 ENCOUNTER — Other Ambulatory Visit: Payer: Self-pay | Admitting: Nurse Practitioner

## 2019-02-23 DIAGNOSIS — E875 Hyperkalemia: Secondary | ICD-10-CM

## 2019-03-25 ENCOUNTER — Ambulatory Visit: Payer: Medicaid Other | Admitting: Nurse Practitioner

## 2019-09-11 ENCOUNTER — Other Ambulatory Visit: Payer: Self-pay

## 2019-09-11 ENCOUNTER — Encounter: Payer: Self-pay | Admitting: Nurse Practitioner

## 2019-09-11 ENCOUNTER — Ambulatory Visit (INDEPENDENT_AMBULATORY_CARE_PROVIDER_SITE_OTHER): Payer: Medicaid Other | Admitting: Nurse Practitioner

## 2019-09-11 VITALS — BP 160/100 | HR 83 | Temp 97.6°F | Resp 18 | Ht 69.0 in | Wt 151.6 lb

## 2019-09-11 DIAGNOSIS — I10 Essential (primary) hypertension: Secondary | ICD-10-CM

## 2019-09-11 DIAGNOSIS — Z13220 Encounter for screening for lipoid disorders: Secondary | ICD-10-CM | POA: Diagnosis not present

## 2019-09-11 DIAGNOSIS — Z13228 Encounter for screening for other metabolic disorders: Secondary | ICD-10-CM

## 2019-09-11 DIAGNOSIS — R030 Elevated blood-pressure reading, without diagnosis of hypertension: Secondary | ICD-10-CM

## 2019-09-11 DIAGNOSIS — N529 Male erectile dysfunction, unspecified: Secondary | ICD-10-CM

## 2019-09-11 LAB — COMPLETE METABOLIC PANEL WITH GFR
AG Ratio: 1.5 (calc) (ref 1.0–2.5)
ALT: 20 U/L (ref 9–46)
AST: 29 U/L (ref 10–40)
Albumin: 4.1 g/dL (ref 3.6–5.1)
Alkaline phosphatase (APISO): 71 U/L (ref 36–130)
BUN: 13 mg/dL (ref 7–25)
CO2: 30 mmol/L (ref 20–32)
Calcium: 9.9 mg/dL (ref 8.6–10.3)
Chloride: 108 mmol/L (ref 98–110)
Creat: 1.02 mg/dL (ref 0.60–1.35)
GFR, Est African American: 100 mL/min/{1.73_m2} (ref >=60)
GFR, Est Non African American: 87 mL/min/{1.73_m2} (ref >=60)
Globulin: 2.7 g/dL (calc) (ref 1.9–3.7)
Glucose, Bld: 96 mg/dL (ref 65–99)
Potassium: 4.6 mmol/L (ref 3.5–5.3)
Sodium: 146 mmol/L (ref 135–146)
Total Bilirubin: 0.3 mg/dL (ref 0.2–1.2)
Total Protein: 6.8 g/dL (ref 6.1–8.1)

## 2019-09-11 LAB — CBC WITH DIFFERENTIAL/PLATELET
Absolute Monocytes: 728 cells/uL (ref 200–950)
Basophils Absolute: 56 cells/uL (ref 0–200)
Basophils Relative: 0.7 %
Eosinophils Absolute: 128 cells/uL (ref 15–500)
Eosinophils Relative: 1.6 %
HCT: 45.8 % (ref 38.5–50.0)
Hemoglobin: 15.8 g/dL (ref 13.2–17.1)
Lymphs Abs: 3256 cells/uL (ref 850–3900)
MCH: 36.5 pg — ABNORMAL HIGH (ref 27.0–33.0)
MCHC: 34.5 g/dL (ref 32.0–36.0)
MCV: 105.8 fL — ABNORMAL HIGH (ref 80.0–100.0)
MPV: 10.4 fL (ref 7.5–12.5)
Monocytes Relative: 9.1 %
Neutro Abs: 3832 cells/uL (ref 1500–7800)
Neutrophils Relative %: 47.9 %
Platelets: 288 10*3/uL (ref 140–400)
RBC: 4.33 10*6/uL (ref 4.20–5.80)
RDW: 14.3 % (ref 11.0–15.0)
Total Lymphocyte: 40.7 %
WBC: 8 10*3/uL (ref 3.8–10.8)

## 2019-09-11 LAB — LIPID PANEL
Cholesterol: 346 mg/dL — ABNORMAL HIGH (ref <200)
HDL: 43 mg/dL (ref >=40)
Non-HDL Cholesterol (Calc): 303 mg/dL (calc) — ABNORMAL HIGH (ref <130)
Total CHOL/HDL Ratio: 8 (calc) — ABNORMAL HIGH (ref <5.0)
Triglycerides: 427 mg/dL — ABNORMAL HIGH (ref <150)

## 2019-09-11 MED ORDER — NEBIVOLOL HCL 5 MG PO TABS
5.0000 mg | ORAL_TABLET | Freq: Every day | ORAL | Status: DC
  Administered 2019-09-11: 5 mg via ORAL

## 2019-09-11 MED ORDER — HYDROCHLOROTHIAZIDE 25 MG PO TABS: 25.0000 mg | ORAL_TABLET | Freq: Every day | ORAL | 3 refills | Status: DC

## 2019-09-11 MED ORDER — SILDENAFIL CITRATE 100 MG PO TABS: 50.0000 mg | ORAL_TABLET | Freq: Every day | ORAL | 3 refills | Status: AC | PRN

## 2019-09-11 MED ORDER — PRAVASTATIN SODIUM 40 MG PO TABS: 40.0000 mg | ORAL_TABLET | Freq: Every day | ORAL | 3 refills | Status: DC

## 2019-09-11 MED FILL — HYDROCHLOROTHIAZIDE 25 MG T: 25 | 90 days supply | Qty: 90 | Fill #0

## 2019-09-11 MED FILL — PRAVASTATIN NA 40 MG TAB: 40 | 90 days supply | Qty: 90 | Fill #0

## 2019-09-11 NOTE — Progress Notes (Signed)
New Patient Office Visit  Subjective:  Patient ID: Ernest Ginley., male    DOB: September 18, 1970  Age: 49 y.o. MRN: [id number]  CC:  Chief Complaint  Patient presents with  . Establish Care    NP    HPI Ernest Jaquay. is a 49 year old male presenting to establish care. Today his blood pressure is elevated. He denied ever being told his blood pressure was high. Bystolic 5 mg administered in clinic and did bring BP down. Discussed HCTZ, home bp log, bringing to apt in 2 weeks for follow up. Refill request for Viagra. He is treated for high lipids with atorvastatin and reports he would like to change feeling it leaves a metal taste in his mouth. Change atorvastatin to pravastatin. Pt does have significant dental caries which could be contributing to the taste. He was encouraged to seek dental treatment. He denied cp/ct, gu/gi sxs, pain, edema, sob, or falls over past 12 months.  Past Medical History:  Diagnosis Date  . Alcohol abuse    4 DUI, lost license many times  . History of shingles   . Hyperlipidemia   . Stroke [facility]) 2013   Acute left dorsolateral thalamus ischemic CVA, no residual sxs.    No past surgical history on file.  Family History  Problem Relation Age of Onset  . Diabetes Mother   . Cancer Mother        ovarian  . Hypertension Mother   . Deep vein thrombosis Mother   . Stroke Mother   . Multiple sclerosis Mother   . Diabetes Maternal Aunt   . Stroke Paternal Aunt   . Cancer Maternal Aunt        lung    Social History   Socioeconomic History  . Marital status: Married    Spouse name: Not on file  . Number of children: Not on file  . Years of education: Not on file  . Highest education level: Not on file  Occupational History  . Not on file  Tobacco Use  . Smoking status: Current Every Day Smoker    Packs/day: 0.25    Types: Cigarettes  . Smokeless tobacco: Never Used  Substance and Sexual Activity  . Alcohol use: Yes    Alcohol/week: 1.0  standard drinks    Types: 1 Cans of beer per week  . Drug use: No    Comment: THC in past  . Sexual activity: Yes  Other Topics Concern  . Not on file  Social History Narrative   Lives alone in house supported by father. Gets food stamps. Single. No kids. 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 Large family around him.   Social Determinants of Health   Financial Resource Strain:   . Difficulty of Paying Living Expenses:   Food Insecurity:   . Worried About Programme researcher, broadcasting/film/video in the Last Year:   . Barista in the Last Year:   Transportation Needs:   . Freight forwarder (Medical):   Marland Kitchen Lack of Transportation (Non-Medical):   Physical Activity:   . Days of Exercise per Week:   . Minutes of Exercise per Session:   Stress:   . Feeling of Stress :   Social Connections:   . Frequency of Communication with Friends and Family:   . Frequency of Social Gatherings with Friends and Family:   . Attends Religious  Services:   . Active Member of Clubs or Organizations:   . Attends Banker Meetings:   Marland Kitchen Marital Status:   Intimate Partner Violence:   . Fear of Current or Ex-Partner:   . Emotionally Abused:   Marland Kitchen Physically Abused:   . Sexually Abused:     ROS Review of Systems  All other systems reviewed and are negative.   Objective:   Today's Vitals: BP (!) 160/100 (BP Location: Left Arm, Patient Position: Sitting, Cuff Size: Normal)   Pulse 83   Temp 97.6 F (36.4 C) (Temporal)   Resp 18   Ht 5\' 9"  (1.753 m)   Wt 151 lb 9.6 oz (68.8 kg)   SpO2 98%   BMI 22.39 kg/m   Physical Exam Vitals and nursing note reviewed.  Constitutional:      Appearance: Normal appearance. He is well-developed. He is not ill-appearing or toxic-appearing.  HENT:     Head: Normocephalic.     Right Ear: Hearing, tympanic membrane, ear canal and external ear normal.     Left Ear: Hearing, tympanic membrane, ear  canal and external ear normal.     Nose: Nose normal.     Mouth/Throat:     Lips: Pink.     Mouth: Mucous membranes are moist.     Dentition: Dental caries present.     Pharynx: Oropharynx is clear.  Eyes:     General: Lids are normal. Lids are everted, no foreign bodies appreciated.     Extraocular Movements: Extraocular movements intact.     Conjunctiva/sclera: Conjunctivae normal.     Pupils: Pupils are equal, round, and reactive to light.  Neck:     Thyroid: No thyroid mass, thyromegaly or thyroid tenderness.     Vascular: No carotid bruit or JVD.  Cardiovascular:     Rate and Rhythm: Normal rate and regular rhythm.     Pulses: Normal pulses.     Heart sounds: Normal heart sounds, S1 normal and S2 normal.  Pulmonary:     Effort: Pulmonary effort is normal.     Breath sounds: Normal breath sounds.  Chest:     Chest wall: No deformity.  Abdominal:     General: Abdomen is flat. Bowel sounds are normal. There is no abdominal bruit.     Palpations: Abdomen is soft. There is no hepatomegaly or splenomegaly.  Musculoskeletal:        General: Normal range of motion.     Cervical back: Normal range of motion and neck supple.     Right lower leg: No edema.     Left lower leg: No edema.  Lymphadenopathy:     Cervical: No cervical adenopathy.  Skin:    General: Skin is warm and dry.     Capillary Refill: Capillary refill takes less than 2 seconds.  Neurological:     General: No focal deficit present.     Mental Status: He is alert and oriented to person, place, and time.     Cranial Nerves: Cranial nerves are intact.  Psychiatric:        Attention and Perception: Attention normal.        Mood and Affect: Mood normal.        Speech: Speech normal.        Behavior: Behavior is cooperative.        Thought Content: Thought content normal.     Assessment & Plan:  Established care today  Labs for lipid and metabolic screening  Blood pressure elevated today with pt reported  h/o of stroke/TIA?, no h/o HTN, with h/o hyperlipidemia. Bystolic 5 mg given at 62:70 AM.   RX blood pressure medication HCTZ 5 mg to take once daily.   Keep Blood pressure log at home and follow up in clinic in 2 weeks or sooner if home blood pressure is >140/90 3 hours after taking the HCTZ,  Refill Viagra for ED, Change Atorvastatin to pravastatin r/t pt reporting not liking metal taste in mouth from atorvastatin.   Dental Caries could contribute to this sxs. Seek medical care for you teeth health.  Problem List Items Addressed This Visit      Other   Erectile dysfunction   Relevant Medications   sildenafil (VIAGRA) 100 MG tablet   Elevated BP without diagnosis of hypertension   Relevant Medications   nebivolol (BYSTOLIC) tablet 5 mg (Start on 09/11/2019 10:00 AM)    Other Visit Diagnoses    Screening, lipid    -  Primary   Relevant Medications   pravastatin (PRAVACHOL) 40 MG tablet   Other Relevant Orders   Lipid panel   Screening for metabolic disorder       Relevant Orders   CBC with Differential/Platelet   COMPLETE METABOLIC PANEL WITH GFR   Hypertension, unspecified type       Relevant Medications   nebivolol (BYSTOLIC) tablet 5 mg (Start on 09/11/2019 10:00 AM)   sildenafil (VIAGRA) 100 MG tablet   hydrochlorothiazide (HYDRODIURIL) 25 MG tablet   pravastatin (PRAVACHOL) 40 MG tablet      Outpatient Encounter Medications as of 09/11/2019  Medication Sig  . hydrochlorothiazide (HYDRODIURIL) 25 MG tablet Take 1 tablet (25 mg total) by mouth daily.  . pravastatin (PRAVACHOL) 40 MG tablet Take 1 tablet (40 mg total) by mouth daily.  . sildenafil (VIAGRA) 100 MG tablet Take 0.5-1 tablets (50-100 mg total) by mouth daily as needed for erectile dysfunction.  . [DISCONTINUED] aspirin EC 81 MG tablet Take 1 tablet (81 mg total) by mouth daily. (Patient not taking: Reported on 11/28/2016)  . [DISCONTINUED] atorvastatin (LIPITOR) 40 MG tablet Take 1 tablet (40 mg total) by mouth  daily. (Patient not taking: Reported on 09/11/2019)  . [DISCONTINUED] diphenhydrAMINE (BENADRYL) 25 MG tablet Take 1 tablet (25 mg total) by mouth every 8 (eight) hours as needed for itching.  . [DISCONTINUED] sildenafil (VIAGRA) 100 MG tablet Take 0.5-1 tablets (50-100 mg total) by mouth daily as needed for erectile dysfunction. (Patient not taking: Reported on 09/11/2019)   Facility-Administered Encounter Medications as of 09/11/2019  Medication  . nebivolol (BYSTOLIC) tablet 5 mg    Follow-up: Return in about 2 weeks (around 09/25/2019).   Annie Main, FNP

## 2019-09-11 NOTE — Patient Instructions (Signed)
Established care today  Labs for lipid and metabolic screening  Blood pressure elevated today with pt reported h/o of stroke/TIA?, no h/o HTN, with h/o hyperlipidemia. Bystolic 5 mg given at 08:40 AM.   RX blood pressure medication HCTZ 5 mg to take once daily.   Keep Blood pressure log at home and follow up in clinic in 2 weeks or sooner if home blood pressure is >140/90 3 hours after taking the HCTZ,  Refill Viagra for ED, Change Atorvastatin to pravastatin r/t pt reporting not liking metal taste in mouth from atorvastatin.   Dental Caries could contribute to this sxs. Seek medical care for you teeth health.

## 2019-09-15 ENCOUNTER — Other Ambulatory Visit: Payer: Self-pay | Admitting: Nurse Practitioner

## 2019-09-15 DIAGNOSIS — R7309 Other abnormal glucose: Secondary | ICD-10-CM

## 2019-09-15 DIAGNOSIS — D539 Nutritional anemia, unspecified: Secondary | ICD-10-CM

## 2019-09-15 DIAGNOSIS — H16223 Keratoconjunctivitis sicca, not specified as Sjogren's, bilateral: Secondary | ICD-10-CM | POA: Diagnosis not present

## 2019-09-15 DIAGNOSIS — H40033 Anatomical narrow angle, bilateral: Secondary | ICD-10-CM | POA: Diagnosis not present

## 2019-09-15 NOTE — Progress Notes (Signed)
I ordered A1C and B12 with Folate related to labs. Please let pt know.  Also I do not see an appt for 2 wk follow up for BP. Please remind him.

## 2019-10-02 ENCOUNTER — Ambulatory Visit: Payer: Medicaid Other | Admitting: Nurse Practitioner

## 2019-10-07 ENCOUNTER — Other Ambulatory Visit: Payer: Self-pay

## 2019-10-07 ENCOUNTER — Ambulatory Visit (INDEPENDENT_AMBULATORY_CARE_PROVIDER_SITE_OTHER): Payer: Medicaid Other | Admitting: Nurse Practitioner

## 2019-10-07 VITALS — BP 138/80 | HR 97 | Temp 97.6°F | Resp 18 | Wt 149.0 lb

## 2019-10-07 DIAGNOSIS — E785 Hyperlipidemia, unspecified: Secondary | ICD-10-CM | POA: Diagnosis not present

## 2019-10-07 DIAGNOSIS — I10 Essential (primary) hypertension: Secondary | ICD-10-CM | POA: Diagnosis not present

## 2019-10-07 DIAGNOSIS — R7309 Other abnormal glucose: Secondary | ICD-10-CM | POA: Diagnosis not present

## 2019-10-07 NOTE — Patient Instructions (Signed)
Follow up labs in August 2021 with apt one week after. Continue to take blood pressures keeping in log notebook at 3 hours after taking morning blood pressure controlling medication.  Continue to follow low fat, low cholesterol diet, get exercise at least 20 minutes 4-5 times per week. Continue to take medications as prescribed for blood pressure HCTZ and statin for cholesterol may take over-the-counter fish oil or omega-3 at 1000 mg/day.

## 2019-10-07 NOTE — Progress Notes (Addendum)
Established Patient Office Visit  Subjective:  Patient ID: Ernest Retherford., male    DOB: 08-Jan-1971  Age: 49 y.o. MRN: [id number]  CC: Follow-up for blood pressure  HPI Ernest Seefeldt. patient is a 49 year old male.  He presents today for follow-up for blood pressure.  His last visit was in April when he established care.  Copy and pasted from last visit. (Blood pressure elevated today with pt reported h/o of stroke/TIA?, no h/o HTN, with h/o hyperlipidemia. Bystolic 5 mg given at 08:40 AM. RX blood pressure medication HCTZ 5 mg to take once daily. Keep Blood pressure log at home and follow up in clinic in 2 weeks or sooner if home blood pressure is >140/90 3 hours after taking the HCTZ).  Today's blood pressure is 138/80.  Patient denies chest pain/chest tightness GU/GI symptoms, shortness of breath, edema, palpitations, or falls, patient did not bring his blood pressure log from home however he took his medication HCTZ did not immediately take his blood pressure afterwards the blood pressures were slightly elevated.  Instructions were reiterated to take the blood pressure medication) hours and then take blood pressure bring that log into the clinic.  Plan to follow-up in August for repeated labs to recheck cholesterol, A1c, and blood pressure.  Patient will have his labs 1 week prior to the appointment  Past Medical History:  Diagnosis Date  . Alcohol abuse    4 DUI, lost license many times  . History of shingles   . Hyperlipidemia   . Stroke [facility]) 2013   Acute left dorsolateral thalamus ischemic CVA, no residual sxs.    No past surgical history on file.  Family History  Problem Relation Age of Onset  . Diabetes Mother   . Cancer Mother        ovarian  . Hypertension Mother   . Deep vein thrombosis Mother   . Stroke Mother   . Multiple sclerosis Mother   . Diabetes Maternal Aunt   . Stroke Paternal Aunt   . Cancer Maternal Aunt        lung    Social History    Socioeconomic History  . Marital status: Married    Spouse name: Not on file  . Number of children: Not on file  . Years of education: Not on file  . Highest education level: Not on file  Occupational History  . Not on file  Tobacco Use  . Smoking status: Current Every Day Smoker    Packs/day: 0.25    Types: Cigarettes  . Smokeless tobacco: Never Used  Substance and Sexual Activity  . Alcohol use: Yes    Alcohol/week: 1.0 standard drinks    Types: 1 Cans of beer per week  . Drug use: No    Comment: THC in past  . Sexual activity: Yes  Other Topics Concern  . Not on file  Social History Narrative   Lives alone in house supported by father. Gets food stamps. Single. No kids. 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 Large family around him.   Social Determinants of Health   Financial Resource Strain:   . Difficulty of Paying Living Expenses:   Food Insecurity:   . Worried About Programme researcher, broadcasting/film/video in the Last Year:   . Barista in the Last Year:   Transportation Needs:   . Freight forwarder (Medical):   Marland Kitchen  Lack of Transportation (Non-Medical):   Physical Activity:   . Days of Exercise per Week:   . Minutes of Exercise per Session:   Stress:   . Feeling of Stress :   Social Connections:   . Frequency of Communication with Friends and Family:   . Frequency of Social Gatherings with Friends and Family:   . Attends Religious Services:   . Active Member of Clubs or Organizations:   . Attends Archivist Meetings:   Marland Kitchen Marital Status:   Intimate Partner Violence:   . Fear of Current or Ex-Partner:   . Emotionally Abused:   Marland Kitchen Physically Abused:   . Sexually Abused:     Outpatient Medications Prior to Visit  Medication Sig Dispense Refill  . hydrochlorothiazide (HYDRODIURIL) 25 MG tablet Take 1 tablet (25 mg total) by mouth daily. 90 tablet 3  . pravastatin (PRAVACHOL) 40 MG tablet Take 1  tablet (40 mg total) by mouth daily. 90 tablet 3  . sildenafil (VIAGRA) 100 MG tablet Take 0.5-1 tablets (50-100 mg total) by mouth daily as needed for erectile dysfunction. 10 tablet 3   Facility-Administered Medications Prior to Visit  Medication Dose Route Frequency Provider Last Rate Last Admin  . nebivolol (BYSTOLIC) tablet 5 mg  5 mg Oral Daily Redmond Baseman, Crystal A, FNP   5 mg at 09/11/19 0840    No Known Allergies  ROS Review of Systems  All other systems reviewed and are negative.     Objective:    Physical Exam  Constitutional: He is oriented to person, place, and time. Vital signs are normal. He appears well-developed and well-nourished.  HENT:  Head: Normocephalic.  Right Ear: Hearing and ear canal normal.  Left Ear: Hearing and ear canal normal.  Nose: No nasal deformity.  Mouth/Throat: Uvula is midline and oropharynx is clear and moist. Abnormal dentition.  Eyes: Pupils are equal, round, and reactive to light. Conjunctivae, EOM and lids are normal. Lids are everted and swept, no foreign bodies found.  Neck: No JVD present.  Cardiovascular: Normal rate, regular rhythm and normal heart sounds.  Pulmonary/Chest: Effort normal and breath sounds normal.  Abdominal: Soft.  Musculoskeletal:        General: Normal range of motion.     Cervical back: Normal range of motion and neck supple.  Neurological: He is alert and oriented to person, place, and time.  Skin: Skin is warm, dry and intact.  Psychiatric: He has a normal mood and affect. His behavior is normal. Judgment and thought content normal.  Nursing note and vitals reviewed.   BP 138/80 (BP Location: Left Arm, Patient Position: Sitting, Cuff Size: Normal)   Pulse 97   Temp 97.6 F (36.4 C) (Temporal)   Resp 18   Wt 149 lb (67.6 kg)   SpO2 99%   BMI 22.00 kg/m  Wt Readings from Last 3 Encounters:  10/07/19 149 lb (67.6 kg)  09/11/19 151 lb 9.6 oz (68.8 kg)  07/12/17 148 lb 3.2 oz (67.2 kg)     Health  Maintenance Due  Topic Date Due  . COVID-19 Vaccine (1) Never done    There are no preventive care reminders to display for this patient.  Lab Results  Component Value Date   TSH 2.770 07/12/2017   Lab Results  Component Value Date   WBC 8.0 09/11/2019   HGB 15.8 09/11/2019   HCT 45.8 09/11/2019   MCV 105.8 (H) 09/11/2019   PLT 288 09/11/2019   Lab Results  Component Value Date   NA 146 09/11/2019   K 4.6 09/11/2019   CO2 30 09/11/2019   GLUCOSE 96 09/11/2019   BUN 13 09/11/2019   CREATININE 1.02 09/11/2019   BILITOT 0.3 09/11/2019   ALKPHOS 92 02/19/2019   AST 29 09/11/2019   ALT 20 09/11/2019   PROT 6.8 09/11/2019   ALBUMIN 4.1 02/19/2019   CALCIUM 9.9 09/11/2019   ANIONGAP 12 11/01/2014   Lab Results  Component Value Date   CHOL 346 (H) 09/11/2019   Lab Results  Component Value Date   HDL 43 09/11/2019   Lab Results  Component Value Date   LDLCALC  09/11/2019     Comment:     . LDL cholesterol not calculated. Triglyceride levels greater than 400 mg/dL invalidate calculated LDL results. . Reference range: <100 . Desirable range <100 mg/dL for primary prevention;   <70 mg/dL for patients with CHD or diabetic patients  with > or = 2 CHD risk factors. Marland Kitchen LDL-C is now calculated using the Martin-Hopkins  calculation, which is a validated novel method providing  better accuracy than the Friedewald equation in the  estimation of LDL-C.  Horald Pollen et al. Lenox Ahr. 1610;960(45): 2061-2068  (http://education.[facility].com/faq/FAQ164)    Lab Results  Component Value Date   TRIG 427 (H) 09/11/2019   Lab Results  Component Value Date   CHOLHDL 8.0 (H) 09/11/2019   Lab Results  Component Value Date   HGBA1C 5.5 02/19/2019      Assessment & Plan:  1.  Follow up labs in August 2021 with apt one week after. 2.  Continue to take blood pressures keeping in log notebook at 3 hours after taking morning blood pressure controlling medication.  3.   Continue to follow low fat, low cholesterol diet, get exercise at least 20 minutes 4-5 times per week. 4.  Continue to take medications as prescribed for blood pressure HCTZ and statin for cholesterol may take over-the-counter fish oil or omega-3 at 1000 mg/day.  Problem List Items Addressed This Visit    None    Visit Diagnoses    Hypertension, unspecified type    -  Primary   Relevant Orders   COMPLETE METABOLIC PANEL WITH GFR   CBC with Differential/Platelet   Hyperlipidemia, unspecified hyperlipidemia type       Relevant Orders   Lipid panel   Elevated glucose       Relevant Orders   Hemoglobin A1c      Follow-up: Return in about 3 months (around 01/07/2020) for Labs 1 week prior.    Elmore Guise, FNP

## 2019-10-17 DIAGNOSIS — H1013 Acute atopic conjunctivitis, bilateral: Secondary | ICD-10-CM | POA: Diagnosis not present

## 2019-11-05 DIAGNOSIS — H04123 Dry eye syndrome of bilateral lacrimal glands: Secondary | ICD-10-CM | POA: Diagnosis not present

## 2019-12-04 MED FILL — PRAVASTATIN NA 40 MG TAB: 40 | 90 days supply | Qty: 90 | Fill #1

## 2019-12-04 MED FILL — HYDROCHLOROTHIAZIDE 25 MG T: 25 | 90 days supply | Qty: 90 | Fill #1

## 2020-01-07 ENCOUNTER — Other Ambulatory Visit: Payer: Self-pay

## 2020-01-07 ENCOUNTER — Ambulatory Visit (INDEPENDENT_AMBULATORY_CARE_PROVIDER_SITE_OTHER): Payer: Medicaid Other | Admitting: Nurse Practitioner

## 2020-01-07 ENCOUNTER — Encounter: Payer: Self-pay | Admitting: Nurse Practitioner

## 2020-01-07 VITALS — BP 130/89 | HR 91 | Temp 97.5°F | Ht 69.0 in | Wt 153.0 lb

## 2020-01-07 DIAGNOSIS — Z7189 Other specified counseling: Secondary | ICD-10-CM

## 2020-01-07 DIAGNOSIS — R21 Rash and other nonspecific skin eruption: Secondary | ICD-10-CM | POA: Diagnosis not present

## 2020-01-07 DIAGNOSIS — I10 Essential (primary) hypertension: Secondary | ICD-10-CM | POA: Diagnosis not present

## 2020-01-07 DIAGNOSIS — E785 Hyperlipidemia, unspecified: Secondary | ICD-10-CM

## 2020-01-07 MED ORDER — LISINOPRIL 5 MG PO TABS: 5.0000 mg | ORAL_TABLET | Freq: Every day | ORAL | 6 refills | Status: DC

## 2020-01-07 NOTE — Progress Notes (Addendum)
Established Patient Office Visit  Subjective:  Patient ID: Ernest Thomas., male    DOB: 20-Feb-1971  Age: 49 y.o. MRN: [id number]  CC:  Chief Complaint  Patient presents with  . Hypertension  . itching all over    HPI Ernest Thomas. is a 49 year old male presenting to the clinic for htn follow up. The pt established care in 08/2019 and was found to have elevated BP then followed up in 09/2019 and HTN dx with treatment was initiated. The pt also has two other issues of concern to address today.  1.HTN follow up: h/o stroke, HNT, hyperlipidemia, last completed labs where 08/2019, last visit was 10/07/2019 for BP follow up. His treatment plan was RX blood pressure medication HCTZ 5 mg to take once daily. Keep Blood pressure log at home and follow up in clinic in 2 weeks or sooner if home blood pressure is >140/90 3 hours after taking the HCTZ).  2.Lives in [place] having to drive to [facility] to pick up his medications he desires medications be sent to [facility].   3.Pt c/o itching all over his back all the time for past week. No txs tried. He admits having many pets in home and possible allergin contacts. No others with similar sxs. The rash is pin k in color not raised, no drainage.   No cp, ct, gu/gi sxs, pain, sob, edema, palpitation, or injury falls.  Past Medical History:  Diagnosis Date  . Alcohol abuse    4 DUI, lost license many times  . History of shingles   . Hyperlipidemia   . Stroke [facility]) 2013   Acute left dorsolateral thalamus ischemic CVA, no residual sxs.    No past surgical history on file.  Family History  Problem Relation Age of Onset  . Diabetes Mother   . Cancer Mother        ovarian  . Hypertension Mother   . Deep vein thrombosis Mother   . Stroke Mother   . Multiple sclerosis Mother   . Diabetes Maternal Aunt   . Stroke Paternal Aunt   . Cancer Maternal Aunt        lung    Social History   Socioeconomic History  . Marital status: Married     Spouse name: Not on file  . Number of children: Not on file  . Years of education: Not on file  . Highest education level: Not on file  Occupational History  . Not on file  Tobacco Use  . Smoking status: Current Every Day Smoker    Packs/day: 0.25    Types: Cigarettes  . Smokeless tobacco: Never Used  Vaping Use  . Vaping Use: Never used  Substance and Sexual Activity  . Alcohol use: Yes    Alcohol/week: 1.0 standard drink    Types: 1 Cans of beer per week  . Drug use: No    Comment: THC in past  . Sexual activity: Yes  Other Topics Concern  . Not on file  Social History Narrative   Lives alone in house supported by father. Gets food stamps. Single. No kids. 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 Large family around him.   Social Determinants of Health   Financial Resource Strain:   . Difficulty of Paying Living Expenses:   Food Insecurity:   . Worried About Programme researcher, broadcasting/film/video in the Last Year:   .  Ran Out of Food in the Last Year:   Transportation Needs:   . Freight forwarder (Medical):   Marland Kitchen Lack of Transportation (Non-Medical):   Physical Activity:   . Days of Exercise per Week:   . Minutes of Exercise per Session:   Stress:   . Feeling of Stress :   Social Connections:   . Frequency of Communication with Friends and Family:   . Frequency of Social Gatherings with Friends and Family:   . Attends Religious Services:   . Active Member of Clubs or Organizations:   . Attends Banker Meetings:   Marland Kitchen Marital Status:   Intimate Partner Violence:   . Fear of Current or Ex-Partner:   . Emotionally Abused:   Marland Kitchen Physically Abused:   . Sexually Abused:     Outpatient Medications Prior to Visit  Medication Sig Dispense Refill  . hydrochlorothiazide (HYDRODIURIL) 25 MG tablet Take 1 tablet (25 mg total) by mouth daily. 90 tablet 3  . pravastatin (PRAVACHOL) 40 MG tablet Take 1 tablet (40 mg  total) by mouth daily. 90 tablet 3  . sildenafil (VIAGRA) 100 MG tablet Take 0.5-1 tablets (50-100 mg total) by mouth daily as needed for erectile dysfunction. (Patient not taking: Reported on 01/07/2020) 10 tablet 3   Facility-Administered Medications Prior to Visit  Medication Dose Route Frequency Provider Last Rate Last Admin  . nebivolol (BYSTOLIC) tablet 5 mg  5 mg Oral Daily Jenne Pane, Mehmet Scally A, FNP   5 mg at 09/11/19 0840    No Known Allergies  ROS Review of Systems  All other systems reviewed and are negative.     Objective:    Physical Exam Vitals and nursing note reviewed.  Constitutional:      Appearance: Normal appearance.  HENT:     Head: Normocephalic.  Eyes:     General: Lids are normal. Lids are everted, no foreign bodies appreciated.     Extraocular Movements: Extraocular movements intact.     Conjunctiva/sclera: Conjunctivae normal.     Pupils: Pupils are equal, round, and reactive to light.  Neck:     Vascular: No carotid bruit.  Cardiovascular:     Rate and Rhythm: Normal rate.  Pulmonary:     Effort: Pulmonary effort is normal.  Musculoskeletal:        General: Normal range of motion.     Cervical back: Normal range of motion.     Right lower leg: No edema.     Left lower leg: No edema.  Skin:    General: Skin is warm and dry.     Findings: Rash present.          Comments: Green: Macule, mild erythemic, non raised   Neurological:     Mental Status: He is alert.  Psychiatric:        Mood and Affect: Mood normal.        Behavior: Behavior is cooperative.        Thought Content: Thought content normal.        Judgment: Judgment normal.     BP 130/89   Pulse 91   Temp (!) 97.5 F (36.4 C)   Ht 5\' 9"  (1.753 m)   Wt 153 lb (69.4 kg)   SpO2 98%   BMI 22.59 kg/m  Wt Readings from Last 3 Encounters:  01/07/20 153 lb (69.4 kg)  10/07/19 149 lb (67.6 kg)  09/11/19 151 lb 9.6 oz (68.8 kg)     Health Maintenance Due  Topic Date Due  .  COVID-19 Vaccine (1) Never done  . INFLUENZA VACCINE  12/21/2019    There are no preventive care reminders to display for this patient.  Lab Results  Component Value Date   TSH 2.770 07/12/2017   Lab Results  Component Value Date   WBC 8.0 09/11/2019   HGB 15.8 09/11/2019   HCT 45.8 09/11/2019   MCV 105.8 (H) 09/11/2019   PLT 288 09/11/2019   Lab Results  Component Value Date   NA 146 09/11/2019   K 4.6 09/11/2019   CO2 30 09/11/2019   GLUCOSE 96 09/11/2019   BUN 13 09/11/2019   CREATININE 1.02 09/11/2019   BILITOT 0.3 09/11/2019   ALKPHOS 92 02/19/2019   AST 29 09/11/2019   ALT 20 09/11/2019   PROT 6.8 09/11/2019   ALBUMIN 4.1 02/19/2019   CALCIUM 9.9 09/11/2019   ANIONGAP 12 11/01/2014   Lab Results  Component Value Date   CHOL 346 (H) 09/11/2019   Lab Results  Component Value Date   HDL 43 09/11/2019   Lab Results  Component Value Date   LDLCALC  09/11/2019     Comment:     . LDL cholesterol not calculated. Triglyceride levels greater than 400 mg/dL invalidate calculated LDL results. . Reference range: <100 . Desirable range <100 mg/dL for primary prevention;   <70 mg/dL for patients with CHD or diabetic patients  with > or = 2 CHD risk factors. Marland Kitchen LDL-C is now calculated using the Martin-Hopkins  calculation, which is a validated novel method providing  better accuracy than the Friedewald equation in the  estimation of LDL-C.  Horald Pollen et al. Lenox Ahr. 8119;147(82): 2061-2068  (http://education.[employer].com/faq/FAQ164)    Lab Results  Component Value Date   TRIG 427 (H) 09/11/2019   Lab Results  Component Value Date   CHOLHDL 8.0 (H) 09/11/2019   Lab Results  Component Value Date   HGBA1C 5.5 02/19/2019      Assessment & Plan:   Problem List Items Addressed This Visit      Cardiovascular and Mediastinum   Hypertension - Primary   Relevant Medications   lisinopril (ZESTRIL) 5 MG tablet     Other   Hyperlipidemia with  target low density lipoprotein (LDL) cholesterol less than 100 mg/dL (Chronic)   Relevant Medications   lisinopril (ZESTRIL) 5 MG tablet    Other Visit Diagnoses    Rash of back       Encounter for medication counseling         1. HTN: with hyperlipidemia Blood pressure notebook readings from home Systolic range 3 hours after HCTZ 25 mg 130-160 Diastolic 70-110 will add Lisinopril.  Continue to take blood pressures keeping in log notebook at 3 hours after taking morning blood pressure controlling medication.  Continue to follow low fat, low cholesterol diet, get exercise at least 20 minutes 4-5 times per week. Continue to take medications as prescribed for blood pressure HCTZ and statin for cholesterol may take over-the-counter fish oil or omega-3 at 1000 mg/day.  2.rash on back: otc Pepcid 20 mg twice daily, prescription for itching, otc hydrocortisone cream, consider contact allergins such as pets.   3. Medication counseling/discussion: :medications prescriptions requested [facility] to have transferred by this clinic to the [facility] and was informed would be completed by [facility] pharmacy. Pt updated. Meds ordered this encounter  Medications  . lisinopril (ZESTRIL) 5 MG tablet    Sig: Take 1 tablet (5 mg total) by mouth daily.  Dispense:  30 tablet    Refill:  6    Follow-up: Return in about 6 months (around 07/09/2020).    Elmore Guiserystal A Alida Greiner, FNP

## 2020-10-01 ENCOUNTER — Telehealth: Payer: Self-pay | Admitting: Nurse Practitioner

## 2020-10-01 NOTE — Telephone Encounter (Signed)
Left message on patient's voicemail for call back; Shanda Bumps wants to see him in the office for medication management & refill.

## 2020-11-04 ENCOUNTER — Telehealth: Payer: Self-pay

## 2020-11-08 NOTE — Telephone Encounter (Signed)
Appointment scheduled for med management and refill.

## 2020-11-12 ENCOUNTER — Ambulatory Visit (INDEPENDENT_AMBULATORY_CARE_PROVIDER_SITE_OTHER): Payer: Medicaid Other | Admitting: Nurse Practitioner

## 2020-11-12 ENCOUNTER — Other Ambulatory Visit: Payer: Self-pay

## 2020-11-12 ENCOUNTER — Encounter: Payer: Self-pay | Admitting: Nurse Practitioner

## 2020-11-12 VITALS — BP 134/78 | HR 78 | Temp 98.4°F | Ht 69.0 in | Wt 156.2 lb

## 2020-11-12 DIAGNOSIS — E785 Hyperlipidemia, unspecified: Secondary | ICD-10-CM

## 2020-11-12 DIAGNOSIS — Z8673 Personal history of transient ischemic attack (TIA), and cerebral infarction without residual deficits: Secondary | ICD-10-CM

## 2020-11-12 DIAGNOSIS — I10 Essential (primary) hypertension: Secondary | ICD-10-CM

## 2020-11-12 DIAGNOSIS — R21 Rash and other nonspecific skin eruption: Secondary | ICD-10-CM

## 2020-11-12 DIAGNOSIS — F1010 Alcohol abuse, uncomplicated: Secondary | ICD-10-CM

## 2020-11-12 DIAGNOSIS — R748 Abnormal levels of other serum enzymes: Secondary | ICD-10-CM

## 2020-11-12 DIAGNOSIS — Z720 Tobacco use: Secondary | ICD-10-CM | POA: Diagnosis not present

## 2020-11-12 DIAGNOSIS — E781 Pure hyperglyceridemia: Secondary | ICD-10-CM

## 2020-11-12 NOTE — Progress Notes (Signed)
Subjective:    Patient ID: Ernest Hacker., male    DOB: 13-May-1971, 50 y.o.   MRN: [id number]  HPI: Ernest Diana. is a 50 y.o. male presenting for medication refill.  He has been lost to follow-up for more than 1 year.  Chief Complaint  Patient presents with   Hypertension    Refill on medication   HYPERTENSION Patient reports he is currently taking hydrochlorothiazide 25 mg daily and lisinopril 5 mg daily for blood pressure.  He is tolerating these medications well.  He also has a history of ischemic stroke years ago and maintains on a daily baby aspirin with pravastatin for secondary prevention. Hypertension status: controlled  Satisfied with current treatment? yes Duration of hypertension: chronic BP monitoring frequency:  not checking BP medication side effects:  no Medication compliance: Excellent Aspirin: yes Recurrent headaches: no Visual changes: no Palpitations: no Dyspnea: no Chest pain: no Lower extremity edema: no Dizzy/lightheaded: no  HYPERLIPIDEMIA Patient maintains on pravastatin 40 mg daily.  Previous cholesterol level showed triglycerides were greater than 400 and LDL therefore is not calculated. Hyperlipidemia status: due for recheck Satisfied with current treatment?  yes Side effects:  no Medication compliance: excellent compliance Aspirin:  yes The ASCVD Risk score Denman George DC Jr., et al., 2013) failed to calculate for the following reasons:   The valid total cholesterol range is 130 to 320 mg/dL Chest pain:  no Coronary artery disease:  no Family history CAD:  no Family history early CAD:  no  Patient reports he is not fasting today, has had some soda.  He also reports he drinks 1-2 beer per day.  He is currently smoking about 1 pack/day of cigarettes and is interested in cutting back/quitting.  He has been cutting back from 2 packs/day to 1 pack/day.  Patient also reports a rash on his trunk that he has had for over a year.  He reports  it is itchy.  He says the cream that he was given does not work.  He reports that he showers about once per week.  Currently, he lives with his cousin Bonita Quin and feels safe where he lives.  No Known Allergies  Outpatient Encounter Medications as of 11/12/2020  Medication Sig   aspirin EC 81 MG tablet Take 81 mg by mouth daily. Swallow whole.   hydrochlorothiazide (HYDRODIURIL) 25 MG tablet Take 1 tablet (25 mg total) by mouth daily.   lisinopril (ZESTRIL) 5 MG tablet Take 1 tablet (5 mg total) by mouth daily.   pravastatin (PRAVACHOL) 40 MG tablet Take 1 tablet (40 mg total) by mouth daily.   sildenafil (VIAGRA) 100 MG tablet Take 0.5-1 tablets (50-100 mg total) by mouth daily as needed for erectile dysfunction.   cromolyn (OPTICROM) 4 % ophthalmic solution INSTILL 1 DROP TWICE DAILY INTO EACH EYE   EYSUVIS 0.25 % SUSP INSTILL 1 DROP 4 TIMES DAILY INTO EACH EYE   RESTASIS 0.05 % ophthalmic emulsion INSTILL 1 DROP TWICE DAILY INTO EACH EYE   [DISCONTINUED] nebivolol (BYSTOLIC) tablet 5 mg    No facility-administered encounter medications on file as of 11/12/2020.    Patient Active Problem List   Diagnosis Date Noted   Rash 11/14/2020   Essential hypertension 01/07/2020   Erectile dysfunction 10/10/2016   Tick bite of abdomen 10/10/2016   Elevated BP without diagnosis of hypertension 10/10/2016   Tobacco abuse 04/08/2012   Hyperlipidemia with target low density lipoprotein (LDL) cholesterol less than 100 mg/dL 66/10/3014  Teeth decayed 04/08/2012   History of ischemic stroke without residual deficits 04/01/2012   Alcohol abuse 04/01/2012    Past Medical History:  Diagnosis Date   Alcohol abuse    4 DUI, lost license many times   History of shingles    Hyperlipidemia    Stroke (HCC) 2013   Acute left dorsolateral thalamus ischemic CVA, no residual sxs.    Relevant past medical, surgical, family and social history reviewed and updated as indicated. Interim medical history since  our last visit reviewed.  ROS Per HPI unless specifically indicated above     Objective:    BP 134/78   Pulse 78   Temp 98.4 F (36.9 C)   Ht 5\' 9"  (1.753 m)   Wt 156 lb 3.2 oz (70.9 kg)   SpO2 97%   BMI 23.07 kg/m   Wt Readings from Last 3 Encounters:  11/12/20 156 lb 3.2 oz (70.9 kg)  01/07/20 153 lb (69.4 kg)  10/07/19 149 lb (67.6 kg)    Physical Exam Vitals and nursing note reviewed.  Constitutional:      General: He is not in acute distress.    Appearance: Normal appearance. He is not toxic-appearing.  HENT:     Head: Normocephalic and atraumatic.     Right Ear: External ear normal.     Left Ear: External ear normal.     Mouth/Throat:     Mouth: Mucous membranes are moist.     Pharynx: Oropharynx is clear.  Eyes:     General: No scleral icterus.    Extraocular Movements: Extraocular movements intact.     Right eye: Normal extraocular motion.     Left eye: Normal extraocular motion.  Neck:     Vascular: No carotid bruit.  Cardiovascular:     Rate and Rhythm: Normal rate and regular rhythm.     Heart sounds: Normal heart sounds.  Pulmonary:     Effort: Pulmonary effort is normal. No respiratory distress.     Breath sounds: Normal breath sounds. No wheezing, rhonchi or rales.  Abdominal:     General: Abdomen is flat. Bowel sounds are normal.     Palpations: Abdomen is soft.  Musculoskeletal:        General: Normal range of motion.     Cervical back: Normal range of motion.     Right lower leg: No edema.     Left lower leg: No edema.  Lymphadenopathy:     Cervical: No cervical adenopathy.  Skin:    Capillary Refill: Capillary refill takes less than 2 seconds.     Findings: Rash present.          Comments: Slightly raised rash green in color that is present under thick hair on abdomen and back.  Neurological:     Mental Status: He is alert and oriented to person, place, and time.     Gait: Gait abnormal.  Psychiatric:        Mood and Affect: Mood  normal.        Behavior: Behavior normal.        Thought Content: Thought content normal.        Judgment: Judgment normal.      Assessment & Plan:   Problem List Items Addressed This Visit       Cardiovascular and Mediastinum   Essential hypertension - Primary    Chronic.  Blood pressure is acceptable today in clinic.  I encouraged the patient to stop smoking as this  will help his blood pressure and also prevent him from having another stroke.  For now, we will continue current medications including hydrochlorothiazide 25 mg and lisinopril 5 mg.  We will check blood counts today along with kidney function and electrolytes.  Follow-up in 3 months.       Relevant Medications   aspirin EC 81 MG tablet   Other Relevant Orders   Hemoglobin A1c   CBC with Differential/Platelet   COMPLETE METABOLIC PANEL WITH GFR     Musculoskeletal and Integument   Rash    The rash patient is referring to appears to be related to poor hygiene.  When I scraped a gloved finger against his skin, there green skin flaked away.  I suspect he may need to increase the frequency that he washes his skin.  I recommended bathing and showering every other day at a minimum and scrubbing with soap and water on the areas that itch the most.  He can moisturize afterward.  Return to clinic if this does not help.         Other   Tobacco abuse (Chronic)    Chronic.  Patient smoking 1 pack/day.  Reports he has cut down from 2 packs/day.  He is not ready to quit smoking however I did recommend that given his history of stroke.       Hyperlipidemia with target low density lipoprotein (LDL) cholesterol less than 100 mg/dL (Chronic)    Chronic.  Patient maintains on pravastatin 40 mg daily.  We will check lipids today, however patient is not fasting.  Looks like he was previously on atorvastatin and not sure why this changed given history of ischemic stroke.  May consider resuming atorvastatin pending lipid results.  We will  also check liver functions today.  Follow-up in 3 months.       Relevant Medications   aspirin EC 81 MG tablet   Other Relevant Orders   Lipid panel   History of ischemic stroke without residual deficits (Chronic)    Patient maintains on pravastatin and daily baby aspirin.  Unclear as to why atorvastatin was changed to pravastatin, however we will check lipids today.  Patient is not fasting.  Strongly encourage patient to quit smoking, however patient reports he is not ready to quit.  Follow-up 3 months.       Alcohol abuse (Chronic)    Chronic.  Patient reports he drinks 1-2 beer per day.  Encouraged cutting back slowly on alcohol intake given history of ischemic stroke and elevated cholesterol levels including elevated triglycerides.  Follow-up 3 months.         Follow up plan: Return in about 3 months (around 02/12/2021) for hld, htn, history of stroke.

## 2020-11-14 ENCOUNTER — Encounter: Payer: Self-pay | Admitting: Nurse Practitioner

## 2020-11-14 DIAGNOSIS — R21 Rash and other nonspecific skin eruption: Secondary | ICD-10-CM

## 2020-11-14 HISTORY — DX: Rash and other nonspecific skin eruption: R21

## 2020-11-14 NOTE — Assessment & Plan Note (Signed)
Chronic.  Patient reports he drinks 1-2 beer per day.  Encouraged cutting back slowly on alcohol intake given history of ischemic stroke and elevated cholesterol levels including elevated triglycerides.  Follow-up 3 months.

## 2020-11-14 NOTE — Assessment & Plan Note (Addendum)
Chronic.  Patient maintains on pravastatin 40 mg daily.  We will check lipids today, however patient is not fasting.  Looks like he was previously on atorvastatin and not sure why this changed given history of ischemic stroke.  May consider resuming atorvastatin pending lipid results.  We will also check liver functions today.  Follow-up in 3 months.

## 2020-11-14 NOTE — Assessment & Plan Note (Signed)
Chronic.  Blood pressure is acceptable today in clinic.  I encouraged the patient to stop smoking as this will help his blood pressure and also prevent him from having another stroke.  For now, we will continue current medications including hydrochlorothiazide 25 mg and lisinopril 5 mg.  We will check blood counts today along with kidney function and electrolytes.  Follow-up in 3 months.

## 2020-11-14 NOTE — Assessment & Plan Note (Signed)
Chronic.  Patient smoking 1 pack/day.  Reports he has cut down from 2 packs/day.  He is not ready to quit smoking however I did recommend that given his history of stroke.

## 2020-11-14 NOTE — Assessment & Plan Note (Signed)
The rash patient is referring to appears to be related to poor hygiene.  When I scraped a gloved finger against his skin, there green skin flaked away.  I suspect he may need to increase the frequency that he washes his skin.  I recommended bathing and showering every other day at a minimum and scrubbing with soap and water on the areas that itch the most.  He can moisturize afterward.  Return to clinic if this does not help.

## 2020-11-14 NOTE — Assessment & Plan Note (Signed)
Patient maintains on pravastatin and daily baby aspirin.  Unclear as to why atorvastatin was changed to pravastatin, however we will check lipids today.  Patient is not fasting.  Strongly encourage patient to quit smoking, however patient reports he is not ready to quit.  Follow-up 3 months.

## 2020-11-18 ENCOUNTER — Other Ambulatory Visit: Payer: Medicaid Other

## 2020-11-18 ENCOUNTER — Other Ambulatory Visit: Payer: Self-pay

## 2020-11-18 DIAGNOSIS — I10 Essential (primary) hypertension: Secondary | ICD-10-CM | POA: Diagnosis not present

## 2020-11-18 DIAGNOSIS — E785 Hyperlipidemia, unspecified: Secondary | ICD-10-CM | POA: Diagnosis not present

## 2020-11-19 ENCOUNTER — Telehealth: Payer: Self-pay

## 2020-11-19 LAB — COMPLETE METABOLIC PANEL WITH GFR
AG Ratio: 1.4 (calc) (ref 1.0–2.5)
ALT: 74 U/L — ABNORMAL HIGH (ref 9–46)
AST: 53 U/L — ABNORMAL HIGH (ref 10–40)
Albumin: 3.9 g/dL (ref 3.6–5.1)
Alkaline phosphatase (APISO): 162 U/L — ABNORMAL HIGH (ref 36–130)
BUN: 11 mg/dL (ref 7–25)
CO2: 29 mmol/L (ref 20–32)
Calcium: 9.5 mg/dL (ref 8.6–10.3)
Chloride: 108 mmol/L (ref 98–110)
Creat: 1.04 mg/dL (ref 0.60–1.35)
GFR, Est African American: 97 mL/min/{1.73_m2} (ref >=60)
GFR, Est Non African American: 84 mL/min/{1.73_m2} (ref >=60)
Globulin: 2.7 g/dL (calc) (ref 1.9–3.7)
Glucose, Bld: 94 mg/dL (ref 65–99)
Potassium: 4.9 mmol/L (ref 3.5–5.3)
Sodium: 145 mmol/L (ref 135–146)
Total Bilirubin: 0.2 mg/dL (ref 0.2–1.2)
Total Protein: 6.6 g/dL (ref 6.1–8.1)

## 2020-11-19 LAB — CBC WITH DIFFERENTIAL/PLATELET
Absolute Monocytes: 496 cells/uL (ref 200–950)
Basophils Absolute: 59 cells/uL (ref 0–200)
Basophils Relative: 1 %
Eosinophils Absolute: 201 cells/uL (ref 15–500)
Eosinophils Relative: 3.4 %
HCT: 41.9 % (ref 38.5–50.0)
Hemoglobin: 14.4 g/dL (ref 13.2–17.1)
Lymphs Abs: 2531 cells/uL (ref 850–3900)
MCH: 36 pg — ABNORMAL HIGH (ref 27.0–33.0)
MCHC: 34.4 g/dL (ref 32.0–36.0)
MCV: 104.8 fL — ABNORMAL HIGH (ref 80.0–100.0)
MPV: 10.3 fL (ref 7.5–12.5)
Monocytes Relative: 8.4 %
Neutro Abs: 2614 cells/uL (ref 1500–7800)
Neutrophils Relative %: 44.3 %
Platelets: 370 10*3/uL (ref 140–400)
RBC: 4 10*6/uL — ABNORMAL LOW (ref 4.20–5.80)
RDW: 14.4 % (ref 11.0–15.0)
Total Lymphocyte: 42.9 %
WBC: 5.9 10*3/uL (ref 3.8–10.8)

## 2020-11-19 LAB — LIPID PANEL
Cholesterol: 247 mg/dL — ABNORMAL HIGH (ref <200)
HDL: 40 mg/dL (ref >=40)
Non-HDL Cholesterol (Calc): 207 mg/dL (calc) — ABNORMAL HIGH (ref <130)
Total CHOL/HDL Ratio: 6.2 (calc) — ABNORMAL HIGH (ref <5.0)
Triglycerides: 497 mg/dL — ABNORMAL HIGH (ref <150)

## 2020-11-19 LAB — HEMOGLOBIN A1C
Hgb A1c MFr Bld: 5.2 % of total Hgb (ref <5.7)
Mean Plasma Glucose: 103 mg/dL
eAG (mmol/L): 5.7 mmol/L

## 2020-11-19 MED ORDER — OMEGA-3-ACID ETHYL ESTERS 1 G PO CAPS: 2.0000 g | ORAL_CAPSULE | Freq: Two times a day (BID) | ORAL | 1 refills | Status: AC

## 2020-11-19 NOTE — Addendum Note (Signed)
Addended by: Cathlean Marseilles A on: 11/19/2020 08:47 AM   Modules accepted: Orders

## 2020-11-19 NOTE — Progress Notes (Signed)
Spoke with pt regarding lab results, difficult to understand speech, sending letter to pt explaining results. Pt going to get xray done on liver today

## 2020-11-19 NOTE — Telephone Encounter (Signed)
PA request sent to plan. Key [id number]

## 2020-11-26 ENCOUNTER — Other Ambulatory Visit: Payer: Self-pay

## 2020-11-26 ENCOUNTER — Ambulatory Visit (HOSPITAL_COMMUNITY)
Admission: RE | Admit: 2020-11-26 | Discharge: 2020-11-26 | Disposition: A | Discharge status: Home / Self Care | Payer: Medicaid Other | Source: Ambulatory Visit | Attending: Nurse Practitioner | Admitting: Nurse Practitioner

## 2020-11-26 DIAGNOSIS — R748 Abnormal levels of other serum enzymes: Secondary | ICD-10-CM | POA: Insufficient documentation

## 2020-12-02 ENCOUNTER — Encounter: Payer: Self-pay | Admitting: Nurse Practitioner

## 2020-12-02 DIAGNOSIS — K760 Fatty (change of) liver, not elsewhere classified: Secondary | ICD-10-CM | POA: Insufficient documentation

## 2020-12-13 DIAGNOSIS — R1310 Dysphagia, unspecified: Secondary | ICD-10-CM | POA: Diagnosis not present

## 2020-12-13 DIAGNOSIS — K2270 Barrett's esophagus without dysplasia: Secondary | ICD-10-CM | POA: Diagnosis not present

## 2020-12-13 DIAGNOSIS — K319 Disease of stomach and duodenum, unspecified: Secondary | ICD-10-CM | POA: Diagnosis not present

## 2020-12-13 DIAGNOSIS — R933 Abnormal findings on diagnostic imaging of other parts of digestive tract: Secondary | ICD-10-CM | POA: Diagnosis not present

## 2020-12-13 DIAGNOSIS — D490 Neoplasm of unspecified behavior of digestive system: Secondary | ICD-10-CM | POA: Diagnosis not present

## 2020-12-13 DIAGNOSIS — K2970 Gastritis, unspecified, without bleeding: Secondary | ICD-10-CM | POA: Diagnosis not present

## 2020-12-16 DIAGNOSIS — K319 Disease of stomach and duodenum, unspecified: Secondary | ICD-10-CM | POA: Diagnosis not present

## 2021-01-30 NOTE — Progress Notes (Signed)
Subjective:    Patient ID: Ernest Hacker., male    DOB: [birth year], 50 y.o.   MRN: [id number]  HPI: Ernest Thomas. is a 50 y.o. male presenting for follow up and wound on his back.  Chief Complaint  Patient presents with   Follow-up    Follow up 3 month follow up, rash on back , for about 2-3 month , red, icth, painful at time, using topical rubs    Hypertension/HYPERLIPIDEMIA Currently taking lisinopril 5 mg daily, pravastatin 40 mg daily with low vase a 2 g twice daily for cholesterol.  Also taking baby aspirin. Hypertension status: Controlled Blood pressure at home: Not checking Hyperlipidemia status: good compliance Satisfied with current treatment?  yes Side effects:  no Medication compliance: good compliance Aspirin:  yes The 10-year ASCVD risk score (Arnett DK, et al., 2019) is: 13.8%   Values used to calculate the score:     Age: 34 years     Sex: Male     Is Non-Hispanic African American: No     Diabetic: No     Tobacco smoker: Yes     Systolic Blood Pressure: 126 mmHg     Is BP treated: Yes     HDL Cholesterol: 40 mg/dL     Total Cholesterol: 247 mg/dL Chest pain:  no Coronary artery disease:  no Family history CAD:  no Family history early CAD:  no  SKIN INFECTION Duration:  months , recently got red and started draining Location: upper back Itching: yes Burning: no Redness: yes Oozing: yes Scaling: no Blisters: no Painful: no Fevers: no Change in detergents/soaps/personal care products: no Recent illness: no Recent travel:no History of same: no Context: worse Alleviating factors: peroxide Treatments attempted:peroxide Shortness of breath: no  Throat/tongue swelling: no Myalgias/arthralgias: no Fevers: No Nausea/vomiting: No  Alcohol use-tells me today he does not drink alcohol every day.  However, when he does drink, he drinks a whole sixpack.  He tells me he does this a couple times a week.  TOBACCO USER Smoking Status:  Current smoker Smoking Amount: Half pack per day Smoking Onset: Many years ago Smoking Quit Date: Not ready to quit Smoking triggers: Habit, stress Type of tobacco use: Cigarettes Children in the house: no Other household members who smoke: yes  No Known Allergies  Outpatient Encounter Medications as of 01/31/2021  Medication Sig   aspirin EC 81 MG tablet Take 81 mg by mouth daily. Swallow whole.   cromolyn (OPTICROM) 4 % ophthalmic solution INSTILL 1 DROP TWICE DAILY INTO EACH EYE   EYSUVIS 0.25 % SUSP INSTILL 1 DROP 4 TIMES DAILY INTO EACH EYE   omega-3 acid ethyl esters (LOVAZA) 1 g capsule Take 2 capsules (2 g total) by mouth 2 (two) times daily.   RESTASIS 0.05 % ophthalmic emulsion INSTILL 1 DROP TWICE DAILY INTO EACH EYE   sildenafil (VIAGRA) 100 MG tablet Take 0.5-1 tablets (50-100 mg total) by mouth daily as needed for erectile dysfunction.   [DISCONTINUED] hydrochlorothiazide (HYDRODIURIL) 25 MG tablet Take 1 tablet (25 mg total) by mouth daily.   [DISCONTINUED] lisinopril (ZESTRIL) 5 MG tablet Take 1 tablet (5 mg total) by mouth daily.   [DISCONTINUED] pravastatin (PRAVACHOL) 40 MG tablet Take 1 tablet (40 mg total) by mouth daily.   lisinopril (ZESTRIL) 5 MG tablet Take 1 tablet (5 mg total) by mouth daily.   pravastatin (PRAVACHOL) 40 MG tablet Take 1 tablet (40 mg total) by mouth daily.   No  facility-administered encounter medications on file as of 01/31/2021.    Patient Active Problem List   Diagnosis Date Noted   Murmur, cardiac 02/03/2021   Abscess 02/03/2021   Hepatic steatosis 12/02/2020   Essential hypertension 01/07/2020   Erectile dysfunction 10/10/2016   Tobacco abuse 04/08/2012   Hyperlipidemia with target low density lipoprotein (LDL) cholesterol less than 100 mg/dL 42/68/3419   Teeth decayed 04/08/2012   History of ischemic stroke without residual deficits 04/01/2012   Alcohol abuse 04/01/2012    Past Medical History:  Diagnosis Date   Alcohol abuse     4 DUI, lost license many times   History of shingles    Hyperlipidemia    Rash 11/14/2020   Stroke (HCC) 2013   Acute left dorsolateral thalamus ischemic CVA, no residual sxs.   Tick bite of abdomen 10/10/2016    Relevant past medical, surgical, family and social history reviewed and updated as indicated. Interim medical history since our last visit reviewed.  Review of Systems Per HPI unless specifically indicated above     Objective:    BP 126/80 (BP Location: Left Arm, Patient Position: Sitting, Cuff Size: Normal)   Pulse 88   Temp 98.5 F (36.9 C)   Ht 5\' 9"  (1.753 m)   Wt 154 lb (69.9 kg)   SpO2 99%   BMI 22.74 kg/m   Wt Readings from Last 3 Encounters:  01/31/21 154 lb (69.9 kg)  11/12/20 156 lb 3.2 oz (70.9 kg)  01/07/20 153 lb (69.4 kg)    Physical Exam Vitals and nursing note reviewed.  Constitutional:      General: He is not in acute distress.    Appearance: Normal appearance. He is not toxic-appearing.  Eyes:     General: No scleral icterus.       Right eye: No discharge.        Left eye: No discharge.     Extraocular Movements:     Right eye: Normal extraocular motion.     Left eye: Normal extraocular motion.  Neck:     Vascular: No carotid bruit.  Cardiovascular:     Rate and Rhythm: Normal rate and regular rhythm.     Heart sounds: Murmur heard.  Pulmonary:     Effort: Pulmonary effort is normal. No respiratory distress.     Breath sounds: Normal breath sounds. No wheezing, rhonchi or rales.  Abdominal:     General: Abdomen is flat. Bowel sounds are normal.     Palpations: Abdomen is soft.  Musculoskeletal:        General: Normal range of motion.     Cervical back: Normal range of motion.     Right lower leg: No edema.     Left lower leg: No edema.  Lymphadenopathy:     Cervical: No cervical adenopathy.  Skin:    General: Skin is warm and dry.     Capillary Refill: Capillary refill takes less than 2 seconds.     Findings: Abscess  present.          Comments: Firm, cyst-like mass noted to upper mid back.  Approximately 5 cm x 5 cm.  No redness, fluctuance, drainage, or tenderness to palpation.  Immediately ~3 cm to the right of this cyst, smaller, approximately 1.5 x 1 cm red, fluctuant, abscess scabbed over.   Neurological:     Mental Status: He is alert and oriented to person, place, and time.  Psychiatric:        Mood and  Affect: Mood normal.        Behavior: Behavior normal.        Thought Content: Thought content normal.        Judgment: Judgment normal.      Assessment & Plan:   Problem List Items Addressed This Visit       Cardiovascular and Mediastinum   Essential hypertension - Primary    Chronic.  Blood pressure well controlled today in clinic.  Continue lisinopril 5 mg daily.  Refill given.  Not due for lab recheck yet, plan to follow-up in about 1 month for this.      Relevant Medications   lisinopril (ZESTRIL) 5 MG tablet   pravastatin (PRAVACHOL) 40 MG tablet     Other   Tobacco abuse (Chronic)    Patient is a current everyday smoker.  We discussed smoking cessation, however he is in the precontemplative phase and is not ready to quit smoking yet.  I did recommend this.      Hyperlipidemia with target low density lipoprotein (LDL) cholesterol less than 100 mg/dL (Chronic)    Chronic.  Currently taking pravastatin 40 mg daily.  Not due for lipid recheck today, however we will follow-up in 1 month to recheck labs.  Continue pravastatin 40 mg daily and omega-3.  Would like to see LDL cholesterol closer to less than 70 secondary to history of ischemic stroke.      Relevant Medications   lisinopril (ZESTRIL) 5 MG tablet   pravastatin (PRAVACHOL) 40 MG tablet   Alcohol abuse (Chronic)    Counseled on cutting back on alcohol.  Patient reports he will work on this.  Not due for lipid recheck yet-continue pravastatin and omega-3 for now.      Murmur, cardiac    We will obtain echocardiogram to  check for valvular or structural abnormalities.  No new concerning symptoms or findings on exam today.      Relevant Orders   ECHOCARDIOGRAM COMPLETE   Abscess    Acute to upper right back.  This appears to have drained at home and appears to be in healing stages.  I removed the scab and was able to express small amount of purulent drainage.  Wound care provided, wound cleaned and covered.  We will have patient return to clinic in 2 days to reassess wound.  Hold off on antibiotics for now pending reevaluation.        Follow up plan: Return in about 2 days (around 02/02/2021) for wound check.

## 2021-01-31 ENCOUNTER — Ambulatory Visit: Payer: Medicaid Other | Admitting: Nurse Practitioner

## 2021-01-31 ENCOUNTER — Other Ambulatory Visit: Payer: Self-pay

## 2021-01-31 VITALS — BP 126/80 | HR 88 | Temp 98.5°F | Ht 69.0 in | Wt 154.0 lb

## 2021-01-31 DIAGNOSIS — I10 Essential (primary) hypertension: Secondary | ICD-10-CM | POA: Diagnosis not present

## 2021-01-31 DIAGNOSIS — Z720 Tobacco use: Secondary | ICD-10-CM

## 2021-01-31 DIAGNOSIS — R011 Cardiac murmur, unspecified: Secondary | ICD-10-CM

## 2021-01-31 DIAGNOSIS — E785 Hyperlipidemia, unspecified: Secondary | ICD-10-CM

## 2021-01-31 DIAGNOSIS — L0291 Cutaneous abscess, unspecified: Secondary | ICD-10-CM | POA: Diagnosis not present

## 2021-01-31 DIAGNOSIS — F1010 Alcohol abuse, uncomplicated: Secondary | ICD-10-CM

## 2021-01-31 DIAGNOSIS — Z13220 Encounter for screening for lipoid disorders: Secondary | ICD-10-CM

## 2021-01-31 MED ORDER — LISINOPRIL 5 MG PO TABS: 5.0000 mg | ORAL_TABLET | Freq: Every day | ORAL | 1 refills | Status: AC

## 2021-01-31 MED ORDER — PRAVASTATIN SODIUM 40 MG PO TABS: 40.0000 mg | ORAL_TABLET | Freq: Every day | ORAL | 1 refills | Status: DC

## 2021-01-31 NOTE — Patient Instructions (Signed)
[facility] in [place]: [address] [place], [place], [place] [address]

## 2021-02-03 ENCOUNTER — Encounter: Payer: Self-pay | Admitting: Nurse Practitioner

## 2021-02-03 ENCOUNTER — Other Ambulatory Visit: Payer: Self-pay

## 2021-02-03 ENCOUNTER — Ambulatory Visit: Payer: Medicaid Other | Admitting: Nurse Practitioner

## 2021-02-03 VITALS — BP 140/80 | HR 86 | Ht 69.0 in | Wt 154.0 lb

## 2021-02-03 DIAGNOSIS — R011 Cardiac murmur, unspecified: Secondary | ICD-10-CM | POA: Insufficient documentation

## 2021-02-03 DIAGNOSIS — L0291 Cutaneous abscess, unspecified: Secondary | ICD-10-CM | POA: Diagnosis not present

## 2021-02-03 NOTE — Progress Notes (Signed)
Subjective:    Patient ID: Ernest Thomas., male    DOB: 05-11-1971, 50 y.o.   MRN: [id number]  HPI: Ernest Thomas. is a 50 y.o. male presenting for wound check.  Chief Complaint  Patient presents with   Follow-up    2 day follow up for wound    SKIN INFECTION Duration: days Location: upper right back History of trauma in area: no Pain: no Quality: none Severity: n/a Redness: yes Swelling: no Oozing: no Pus: no Fevers: no Nausea/vomiting: no Status: better Treatments attempted: nothing   No Known Allergies  Outpatient Encounter Medications as of 02/03/2021  Medication Sig   aspirin EC 81 MG tablet Take 81 mg by mouth daily. Swallow whole.   cromolyn (OPTICROM) 4 % ophthalmic solution INSTILL 1 DROP TWICE DAILY INTO EACH EYE   EYSUVIS 0.25 % SUSP INSTILL 1 DROP 4 TIMES DAILY INTO EACH EYE   lisinopril (ZESTRIL) 5 MG tablet Take 1 tablet (5 mg total) by mouth daily.   omega-3 acid ethyl esters (LOVAZA) 1 g capsule Take 2 capsules (2 g total) by mouth 2 (two) times daily.   pravastatin (PRAVACHOL) 40 MG tablet Take 1 tablet (40 mg total) by mouth daily.   RESTASIS 0.05 % ophthalmic emulsion INSTILL 1 DROP TWICE DAILY INTO EACH EYE   sildenafil (VIAGRA) 100 MG tablet Take 0.5-1 tablets (50-100 mg total) by mouth daily as needed for erectile dysfunction.   No facility-administered encounter medications on file as of 02/03/2021.    Patient Active Problem List   Diagnosis Date Noted   Murmur, cardiac 02/03/2021   Abscess 02/03/2021   Hepatic steatosis 12/02/2020   Essential hypertension 01/07/2020   Erectile dysfunction 10/10/2016   Tobacco abuse 04/08/2012   Hyperlipidemia with target low density lipoprotein (LDL) cholesterol less than 100 mg/dL 02/40/9735   Teeth decayed 04/08/2012   History of ischemic stroke without residual deficits 04/01/2012   Alcohol abuse 04/01/2012    Past Medical History:  Diagnosis Date   Alcohol abuse    4 DUI, lost  license many times   History of shingles    Hyperlipidemia    Rash 11/14/2020   Stroke (HCC) 2013   Acute left dorsolateral thalamus ischemic CVA, no residual sxs.   Tick bite of abdomen 10/10/2016    Relevant past medical, surgical, family and social history reviewed and updated as indicated. Interim medical history since our last visit reviewed.  Review of Systems Per HPI unless specifically indicated above     Objective:    BP 140/80 (BP Location: Left Arm, Patient Position: Sitting, Cuff Size: Normal)   Pulse 86   Ht 5\' 9"  (1.753 m)   Wt 154 lb (69.9 kg)   SpO2 98%   BMI 22.74 kg/m   Wt Readings from Last 3 Encounters:  02/03/21 154 lb (69.9 kg)  01/31/21 154 lb (69.9 kg)  11/12/20 156 lb 3.2 oz (70.9 kg)    Physical Exam Vitals and nursing note reviewed.  Constitutional:      General: He is not in acute distress.    Appearance: Normal appearance. He is not toxic-appearing.  Eyes:     Pupils: Pupils are equal, round, and reactive to light.  Skin:    General: Skin is warm and dry.     Capillary Refill: Capillary refill takes less than 2 seconds.     Coloration: Skin is not jaundiced or pale.     Findings: Abscess and erythema present.  Comments: 4 cm x  4 cm cyst remains unchanged.  Abscess now 1 cm x 1 cm, erythema has decreased and no oozing/drainage, fluctuance, tenderness to palpation.    Neurological:     Mental Status: He is alert and oriented to person, place, and time.     Motor: No weakness.  Psychiatric:        Mood and Affect: Mood normal.        Behavior: Behavior normal.        Thought Content: Thought content normal.        Judgment: Judgment normal.      Assessment & Plan:  1. Abscess Acute, resolving.  Redness, tenderness is improving.  No systemic s/s of infection.  Continue wound care at home.  Discussed cyst - satellite abscessed may recur if cyst is now removed, however patient declines referral for cyst removal at this time.  Follow up if  abscess worsens or with worsening drainage, tenderness, fevers, nausea/vomiting.    Follow up plan: Return in about 4 weeks (around 03/03/2021) for follow up labs.

## 2021-02-03 NOTE — Assessment & Plan Note (Signed)
Patient is a current everyday smoker.  We discussed smoking cessation, however he is in the precontemplative phase and is not ready to quit smoking yet.  I did recommend this.

## 2021-02-03 NOTE — Assessment & Plan Note (Addendum)
We will obtain echocardiogram to check for valvular or structural abnormalities.  No new concerning symptoms or findings on exam today.

## 2021-02-03 NOTE — Assessment & Plan Note (Signed)
Acute to upper right back.  This appears to have drained at home and appears to be in healing stages.  I removed the scab and was able to express small amount of purulent drainage.  Wound care provided, wound cleaned and covered.  We will have patient return to clinic in 2 days to reassess wound.  Hold off on antibiotics for now pending reevaluation.

## 2021-02-03 NOTE — Assessment & Plan Note (Signed)
Chronic.  Blood pressure well controlled today in clinic.  Continue lisinopril 5 mg daily.  Refill given.  Not due for lab recheck yet, plan to follow-up in about 1 month for this.

## 2021-02-03 NOTE — Assessment & Plan Note (Signed)
Chronic.  Currently taking pravastatin 40 mg daily.  Not due for lipid recheck today, however we will follow-up in 1 month to recheck labs.  Continue pravastatin 40 mg daily and omega-3.  Would like to see LDL cholesterol closer to less than 70 secondary to history of ischemic stroke.

## 2021-02-03 NOTE — Assessment & Plan Note (Signed)
Counseled on cutting back on alcohol.  Patient reports he will work on this.  Not due for lipid recheck yet-continue pravastatin and omega-3 for now.

## 2021-03-03 ENCOUNTER — Other Ambulatory Visit: Payer: Self-pay

## 2021-03-03 ENCOUNTER — Other Ambulatory Visit: Payer: Medicaid Other

## 2021-03-03 DIAGNOSIS — Z8673 Personal history of transient ischemic attack (TIA), and cerebral infarction without residual deficits: Secondary | ICD-10-CM

## 2021-03-03 DIAGNOSIS — E781 Pure hyperglyceridemia: Secondary | ICD-10-CM

## 2021-03-03 DIAGNOSIS — I10 Essential (primary) hypertension: Secondary | ICD-10-CM

## 2021-03-03 DIAGNOSIS — Z13220 Encounter for screening for lipoid disorders: Secondary | ICD-10-CM

## 2021-03-03 DIAGNOSIS — R030 Elevated blood-pressure reading, without diagnosis of hypertension: Secondary | ICD-10-CM | POA: Diagnosis not present

## 2021-03-03 DIAGNOSIS — Z136 Encounter for screening for cardiovascular disorders: Secondary | ICD-10-CM | POA: Diagnosis not present

## 2021-03-03 DIAGNOSIS — E785 Hyperlipidemia, unspecified: Secondary | ICD-10-CM | POA: Diagnosis not present

## 2021-03-04 LAB — COMPLETE METABOLIC PANEL WITH GFR
AG Ratio: 1.3 (calc) (ref 1.0–2.5)
ALT: 18 U/L (ref 9–46)
AST: 29 U/L (ref 10–40)
Albumin: 4.1 g/dL (ref 3.6–5.1)
Alkaline phosphatase (APISO): 117 U/L (ref 36–130)
BUN: 10 mg/dL (ref 7–25)
CO2: 27 mmol/L (ref 20–32)
Calcium: 9.6 mg/dL (ref 8.6–10.3)
Chloride: 109 mmol/L (ref 98–110)
Creat: 0.99 mg/dL (ref 0.60–1.29)
Globulin: 3.2 g/dL (calc) (ref 1.9–3.7)
Glucose, Bld: 82 mg/dL (ref 65–99)
Potassium: 4.1 mmol/L (ref 3.5–5.3)
Sodium: 138 mmol/L (ref 135–146)
Total Bilirubin: 0.3 mg/dL (ref 0.2–1.2)
Total Protein: 7.3 g/dL (ref 6.1–8.1)
eGFR: 93 mL/min/{1.73_m2} (ref >=60)

## 2021-03-04 LAB — LIPID PANEL
Cholesterol: 259 mg/dL — ABNORMAL HIGH (ref <200)
HDL: 49 mg/dL (ref >=40)
LDL Cholesterol (Calc): 174 mg/dL (calc) — ABNORMAL HIGH
Non-HDL Cholesterol (Calc): 210 mg/dL (calc) — ABNORMAL HIGH (ref <130)
Total CHOL/HDL Ratio: 5.3 (calc) — ABNORMAL HIGH (ref <5.0)
Triglycerides: 198 mg/dL — ABNORMAL HIGH (ref <150)

## 2021-03-04 LAB — CBC WITH DIFFERENTIAL/PLATELET
Absolute Monocytes: 631 cells/uL (ref 200–950)
Basophils Absolute: 90 cells/uL (ref 0–200)
Basophils Relative: 1.1 %
Eosinophils Absolute: 303 cells/uL (ref 15–500)
Eosinophils Relative: 3.7 %
HCT: 48.2 % (ref 38.5–50.0)
Hemoglobin: 16.2 g/dL (ref 13.2–17.1)
Lymphs Abs: 2222 cells/uL (ref 850–3900)
MCH: 34.9 pg — ABNORMAL HIGH (ref 27.0–33.0)
MCHC: 33.6 g/dL (ref 32.0–36.0)
MCV: 103.9 fL — ABNORMAL HIGH (ref 80.0–100.0)
MPV: 11.1 fL (ref 7.5–12.5)
Monocytes Relative: 7.7 %
Neutro Abs: 4953 cells/uL (ref 1500–7800)
Neutrophils Relative %: 60.4 %
Platelets: 282 10*3/uL (ref 140–400)
RBC: 4.64 10*6/uL (ref 4.20–5.80)
RDW: 14.7 % (ref 11.0–15.0)
Total Lymphocyte: 27.1 %
WBC: 8.2 10*3/uL (ref 3.8–10.8)

## 2021-03-04 MED ORDER — PRAVASTATIN SODIUM 80 MG PO TABS: 80.0000 mg | ORAL_TABLET | Freq: Every day | ORAL | 1 refills | Status: AC

## 2021-07-05 DIAGNOSIS — H5213 Myopia, bilateral: Secondary | ICD-10-CM | POA: Diagnosis not present

## 2022-09-25 ENCOUNTER — Telehealth: Payer: Self-pay

## 2022-09-25 NOTE — Telephone Encounter (Signed)
LVM for patient to call back. AS, CMA
